# Patient Record
Sex: Female | Born: 1980 | Race: White | Hispanic: No | Smoking: Never smoker
Health system: Southern US, Community
[De-identification: ages and names within clinical notes are randomized; demographics above are authoritative.]

## PROBLEM LIST (undated history)

## (undated) DIAGNOSIS — Z8619 Personal history of other infectious and parasitic diseases: Secondary | ICD-10-CM

## (undated) DIAGNOSIS — T8859XA Other complications of anesthesia, initial encounter: Secondary | ICD-10-CM

## (undated) DIAGNOSIS — R112 Nausea with vomiting, unspecified: Secondary | ICD-10-CM

## (undated) DIAGNOSIS — G43909 Migraine, unspecified, not intractable, without status migrainosus: Secondary | ICD-10-CM

## (undated) DIAGNOSIS — I10 Essential (primary) hypertension: Secondary | ICD-10-CM

## (undated) DIAGNOSIS — T4145XA Adverse effect of unspecified anesthetic, initial encounter: Secondary | ICD-10-CM

## (undated) DIAGNOSIS — R87629 Unspecified abnormal cytological findings in specimens from vagina: Secondary | ICD-10-CM

## (undated) HISTORY — DX: Migraine, unspecified, not intractable, without status migrainosus: G43.909

## (undated) HISTORY — DX: Personal history of other infectious and parasitic diseases: Z86.19

## (undated) HISTORY — PX: LEEP: SHX91

## (undated) HISTORY — DX: Unspecified abnormal cytological findings in specimens from vagina: R87.629

---
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
## 2014-11-05 ENCOUNTER — Encounter (HOSPITAL_COMMUNITY): Payer: Self-pay | Admitting: Emergency Medicine

## 2014-11-05 ENCOUNTER — Emergency Department (HOSPITAL_COMMUNITY): Payer: Federal, State, Local not specified - PPO

## 2014-11-05 ENCOUNTER — Observation Stay (HOSPITAL_COMMUNITY): Payer: Federal, State, Local not specified - PPO

## 2014-11-05 ENCOUNTER — Observation Stay (HOSPITAL_COMMUNITY)
Admission: EM | Admit: 2014-11-05 | Discharge: 2014-11-07 | Disposition: A | Discharge status: Home / Self Care | Payer: Federal, State, Local not specified - PPO | Attending: Obstetrics & Gynecology | Admitting: Obstetrics & Gynecology

## 2014-11-05 DIAGNOSIS — D649 Anemia, unspecified: Secondary | ICD-10-CM | POA: Diagnosis present

## 2014-11-05 DIAGNOSIS — N939 Abnormal uterine and vaginal bleeding, unspecified: Secondary | ICD-10-CM

## 2014-11-05 DIAGNOSIS — K219 Gastro-esophageal reflux disease without esophagitis: Secondary | ICD-10-CM | POA: Diagnosis not present

## 2014-11-05 DIAGNOSIS — Z6841 Body Mass Index (BMI) 40.0 and over, adult: Secondary | ICD-10-CM | POA: Insufficient documentation

## 2014-11-05 DIAGNOSIS — Z79899 Other long term (current) drug therapy: Secondary | ICD-10-CM | POA: Diagnosis not present

## 2014-11-05 DIAGNOSIS — N921 Excessive and frequent menstruation with irregular cycle: Secondary | ICD-10-CM | POA: Diagnosis present

## 2014-11-05 DIAGNOSIS — N840 Polyp of corpus uteri: Secondary | ICD-10-CM | POA: Diagnosis not present

## 2014-11-05 DIAGNOSIS — I10 Essential (primary) hypertension: Secondary | ICD-10-CM | POA: Insufficient documentation

## 2014-11-05 DIAGNOSIS — N946 Dysmenorrhea, unspecified: Secondary | ICD-10-CM | POA: Diagnosis not present

## 2014-11-05 HISTORY — DX: Essential (primary) hypertension: I10

## 2014-11-05 LAB — HEMOGLOBIN AND HEMATOCRIT, BLOOD
HEMATOCRIT: 27.8 % — AB (ref 36.0–46.0)
Hemoglobin: 8.4 g/dL — ABNORMAL LOW (ref 12.0–15.0)

## 2014-11-05 LAB — BASIC METABOLIC PANEL
Anion gap: 8 (ref 5–15)
BUN: 9 mg/dL (ref 6–20)
CHLORIDE: 99 mmol/L — AB (ref 101–111)
CO2: 25 mmol/L (ref 22–32)
CREATININE: 0.79 mg/dL (ref 0.44–1.00)
Calcium: 8.5 mg/dL — ABNORMAL LOW (ref 8.9–10.3)
Glucose, Bld: 123 mg/dL — ABNORMAL HIGH (ref 65–99)
POTASSIUM: 3.8 mmol/L (ref 3.5–5.1)
SODIUM: 132 mmol/L — AB (ref 135–145)

## 2014-11-05 LAB — CBC WITH DIFFERENTIAL/PLATELET
BASOS ABS: 0 10*3/uL (ref 0.0–0.1)
Basophils Relative: 0 %
EOS ABS: 0.1 10*3/uL (ref 0.0–0.7)
EOS PCT: 1 %
HCT: 22.6 % — ABNORMAL LOW (ref 36.0–46.0)
HEMOGLOBIN: 6.4 g/dL — AB (ref 12.0–15.0)
LYMPHS ABS: 2.4 10*3/uL (ref 0.7–4.0)
LYMPHS PCT: 18 %
MCH: 20.3 pg — AB (ref 26.0–34.0)
MCHC: 28.3 g/dL — ABNORMAL LOW (ref 30.0–36.0)
MCV: 71.7 fL — ABNORMAL LOW (ref 78.0–100.0)
Monocytes Absolute: 0.8 10*3/uL (ref 0.1–1.0)
Monocytes Relative: 6 %
NEUTROS PCT: 74 %
Neutro Abs: 9.7 10*3/uL — ABNORMAL HIGH (ref 1.7–7.7)
PLATELETS: 438 10*3/uL — AB (ref 150–400)
RBC: 3.15 MIL/uL — AB (ref 3.87–5.11)
RDW: 18.9 % — ABNORMAL HIGH (ref 11.5–15.5)
WBC: 13 10*3/uL — AB (ref 4.0–10.5)

## 2014-11-05 LAB — I-STAT BETA HCG BLOOD, ED (MC, WL, AP ONLY): HCG, QUANTITATIVE: 8.5 m[IU]/mL — AB (ref <5)

## 2014-11-05 LAB — PREPARE RBC (CROSSMATCH)

## 2014-11-05 LAB — SAMPLE TO BLOOD BANK

## 2014-11-05 LAB — ABO/RH: ABO/RH(D): O NEG

## 2014-11-05 MED ORDER — KETOROLAC TROMETHAMINE 60 MG/2ML IM SOLN: 60.0000 mg | Freq: Once | INTRAMUSCULAR | Status: DC

## 2014-11-05 MED ORDER — MEGESTROL ACETATE 40 MG PO TABS
120.0000 mg | ORAL_TABLET | Freq: Every day | ORAL | Status: DC
  Administered 2014-11-05 – 2014-11-06 (×2): 120 mg via ORAL
  Filled 2014-11-05 (×5): qty 3

## 2014-11-05 MED ORDER — ACETAMINOPHEN 325 MG PO TABS
650.0000 mg | ORAL_TABLET | Freq: Once | ORAL | Status: AC
  Administered 2014-11-05: 650 mg via ORAL
  Filled 2014-11-05: qty 2

## 2014-11-05 MED ORDER — ESTROGENS CONJUGATED 25 MG IJ SOLR
25.0000 mg | Freq: Four times a day (QID) | INTRAMUSCULAR | Status: DC
  Administered 2014-11-05 – 2014-11-07 (×8): 25 mg via INTRAVENOUS
  Filled 2014-11-05 (×17): qty 25

## 2014-11-05 MED ORDER — ESTROGENS CONJUGATED 25 MG IJ SOLR
INTRAMUSCULAR | Status: AC
  Filled 2014-11-05: qty 25

## 2014-11-05 MED ORDER — ESTROGENS CONJUGATED 25 MG IJ SOLR
25.0000 mg | Freq: Once | INTRAMUSCULAR | Status: DC
  Filled 2014-11-05: qty 25

## 2014-11-05 MED ORDER — FUROSEMIDE 10 MG/ML IJ SOLN
20.0000 mg | Freq: Once | INTRAMUSCULAR | Status: AC
  Administered 2014-11-05: 20 mg via INTRAVENOUS
  Filled 2014-11-05: qty 2

## 2014-11-05 MED ORDER — FUROSEMIDE 10 MG/ML IJ SOLN: 20.0000 mg | Freq: Once | INTRAMUSCULAR | Status: AC

## 2014-11-05 MED ORDER — SODIUM CHLORIDE 0.9 % IV SOLN
Freq: Once | INTRAVENOUS | Status: AC
  Administered 2014-11-05: 11:00:00 via INTRAVENOUS

## 2014-11-05 NOTE — Progress Notes (Signed)
Informed by patient that her bleeding had gotten worse since the transvaginal US. Patient states that she has bled through two pads in an hour and a half. MD aware. Will continue to monitor and do pad count.

## 2014-11-05 NOTE — Progress Notes (Signed)
1747 Dr.Eure notified regarding order to transfuse 2 units RBCs. Patient has already received 2 units of RBCs at this time. Awaiting response from MD at this time.  

## 2014-11-05 NOTE — ED Provider Notes (Signed)
CSN: [id number]     Arrival date & time 11/05/14  0718 History  By signing my name below, I, Ronney Lion, attest that this documentation has been prepared under the direction and in the presence of Mancel Bale, MD. Electronically Signed: Ronney Lion, ED Scribe. 11/05/2014. 8:49 AM.   Chief Complaint  Patient presents with  . Dizziness  . Vaginal Bleeding   The history is provided by the patient. No language interpreter was used.    HPI Comments: Sheryl [name] is a 34 y.o. female with a history of hypertension, who presents to the Emergency Department complaining of intermittent lightheaded dizziness that began in August since she began bleeding vaginally, and worsened today. Patient reports she has been on her period since it started in August. She had seen her PCP Lovenia Kim, PA-C 3 days ago and was told she was anemic. She was instructed to come to the ED for worsening symptoms. She reports she has begun to feel a rapid heart beat, lightheadedness, and SOB whenever she stands up, feeling near-syncopal with walking a short distance. Her PCP had given her iron but did not recommend any other treatments. Patient states she vomited once after taking the iron and getting into the shower, although she feels unsure what caused her vomiting. She denies loss of appetite or chest pain. Patient reports a history of hypertension.  Past Medical History  Diagnosis Date  . Hypertension    History reviewed. No pertinent past surgical history. History reviewed. No pertinent family history. Social History  Substance Use Topics  . Smoking status: Never Smoker   . Smokeless tobacco: None  . Alcohol Use: No   OB History    No data available     Review of Systems  All other systems reviewed and are negative.     Allergies  Review of patient's allergies indicates no known allergies.  Home Medications   Prior to Admission medications   Medication Sig Start Date End Date Taking? Authorizing  Provider  bisoprolol-hydrochlorothiazide (ZIAC) 2.5-6.25 MG tablet Take 3 tablets by mouth daily.  11/01/14  Yes Historical Provider, MD  ibuprofen (ADVIL,MOTRIN) 200 MG tablet Take 800 mg by mouth every 6 (six) hours as needed for headache.   Yes Historical Provider, MD  IRON PO Take 2 tablets by mouth daily.   Yes Historical Provider, MD   BP 120/63 mmHg  Pulse 78  Temp(Src) 98.1 F (36.7 C) (Oral)  Resp 16  Ht  (1.727 m)  Wt 277 lb 6.4 oz (125.828 kg)  BMI 42.19 kg/m2  SpO2 99%  LMP 10/13/2014 Physical Exam  Constitutional: She is oriented to person, place, and time. She appears well-developed and well-nourished.  HENT:  Head: Normocephalic and atraumatic.  Eyes: Conjunctivae and EOM are normal. Pupils are equal, round, and reactive to light.  Neck: Normal range of motion and phonation normal. Neck supple.  Cardiovascular: Normal rate and regular rhythm.   Pulmonary/Chest: Effort normal and breath sounds normal. She exhibits no tenderness.  Abdominal: Soft. She exhibits no distension. There is no tenderness. There is no guarding.  Musculoskeletal: Normal range of motion.  Neurological: She is alert and oriented to person, place, and time. She exhibits normal muscle tone.  Skin: Skin is warm and dry.  Psychiatric: She has a normal mood and affect. Her behavior is normal. Judgment and thought content normal.  Nursing note and vitals reviewed.   ED Course  Procedures (including critical care time)  DIAGNOSTIC STUDIES: Oxygen Saturation is 98%  on RA, normal by my interpretation.    COORDINATION OF CARE: 8:45 AM - Discussed treatment plan with pt at bedside which includes blood transfusion. Pt verbalized understanding and agreed to plan.   Labs Review Labs Reviewed  BASIC METABOLIC PANEL - Abnormal; Notable for the following:    Sodium 132 (*)    Chloride 99 (*)    Glucose, Bld 123 (*)    Calcium 8.5 (*)    All other components within normal limits  CBC WITH  DIFFERENTIAL/PLATELET - Abnormal; Notable for the following:    WBC 13.0 (*)    RBC 3.15 (*)    Hemoglobin 6.4 (*)    HCT 22.6 (*)    MCV 71.7 (*)    MCH 20.3 (*)    MCHC 28.3 (*)    RDW 18.9 (*)    Platelets 438 (*)    Neutro Abs 9.7 (*)    All other components within normal limits  I-STAT BETA HCG BLOOD, ED ([facility], [facility], [facility] ONLY) - Abnormal; Notable for the following:    I-stat hCG, quantitative 8.5 (*)    All other components within normal limits  SAMPLE TO BLOOD BANK  TYPE AND SCREEN  PREPARE RBC (CROSSMATCH)  ABO/RH  PREPARE RBC (CROSSMATCH)    Imaging Review US Transvaginal Non-ob  11/05/2014   CLINICAL DATA:  Menometrorrhagia.  Anemia.  LMP 10/13/2014.  EXAM: TRANSABDOMINAL AND TRANSVAGINAL ULTRASOUND OF PELVIS  TECHNIQUE: Both transabdominal and transvaginal ultrasound examinations of the pelvis were performed. Transabdominal technique was performed for global imaging of the pelvis including uterus, ovaries, adnexal regions, and pelvic cul-de-sac. It was necessary to proceed with endovaginal exam following the transabdominal exam to visualize the endometrium and cystic lesion in right adnexa.  COMPARISON:  None  FINDINGS: Uterus  Measurements: 10.2 x 5.1 x 5.5 cm. Diffusely heterogeneous echotexture of uterine myometrium noted, however no distinct fibroids visualized.  Endometrium  Thickness: 24 mm. Heterogeneous appearance noted, although no definite focal abnormality visualized.  Right ovary  Measurements: 5.5 x 4.5 x 5.3 cm. 3.7 cm benign appearing appearing cyst noted. No complex cystic or solid masses visualized.  Left ovary  Measurements: 3.1 x 1.9 x 2.4 cm. Normal appearance/no adnexal mass.  Other findings  No free fluid.  IMPRESSION: Diffusely heterogeneous myometrial echotexture, without definite fibroids.  Endometrial thickness measures 24 mm. If bleeding remains unresponsive to hormonal or medical therapy, focal lesion work-up with sonohysterogram or hysteroscopy should be  considered. Endometrial biopsy should also be considered in pre-menopausal patients at high risk for endometrial carcinoma. (Ref: Radiological Reasoning: Algorithmic Workup of Abnormal Vaginal Bleeding with Endovaginal Sonography and Sonohysterography. AJR 2008; 161:W96-04).  3.7 cm benign appearing right ovarian cyst.   Electronically Signed   By: Myles Rosenthal M.D.   On: 11/05/2014 12:05   US Pelvis Complete  11/05/2014   CLINICAL DATA:  Menometrorrhagia.  Anemia.  LMP 10/13/2014.  EXAM: TRANSABDOMINAL AND TRANSVAGINAL ULTRASOUND OF PELVIS  TECHNIQUE: Both transabdominal and transvaginal ultrasound examinations of the pelvis were performed. Transabdominal technique was performed for global imaging of the pelvis including uterus, ovaries, adnexal regions, and pelvic cul-de-sac. It was necessary to proceed with endovaginal exam following the transabdominal exam to visualize the endometrium and cystic lesion in right adnexa.  COMPARISON:  None  FINDINGS: Uterus  Measurements: 10.2 x 5.1 x 5.5 cm. Diffusely heterogeneous echotexture of uterine myometrium noted, however no distinct fibroids visualized.  Endometrium  Thickness: 24 mm. Heterogeneous appearance noted, although no definite focal abnormality visualized.  Right ovary  Measurements: 5.5 x 4.5 x 5.3 cm. 3.7 cm benign appearing appearing cyst noted. No complex cystic or solid masses visualized.  Left ovary  Measurements: 3.1 x 1.9 x 2.4 cm. Normal appearance/no adnexal mass.  Other findings  No free fluid.  IMPRESSION: Diffusely heterogeneous myometrial echotexture, without definite fibroids.  Endometrial thickness measures 24 mm. If bleeding remains unresponsive to hormonal or medical therapy, focal lesion work-up with sonohysterogram or hysteroscopy should be considered. Endometrial biopsy should also be considered in pre-menopausal patients at high risk for endometrial carcinoma. (Ref: Radiological Reasoning: Algorithmic Workup of Abnormal Vaginal Bleeding  with Endovaginal Sonography and Sonohysterography. AJR 2008; 147:W29-56).  3.7 cm benign appearing right ovarian cyst.   Electronically Signed   By: Myles Rosenthal M.D.   On: 11/05/2014 12:05   I have personally reviewed and evaluated these images and lab results as part of my medical decision-making.   EKG Interpretation None      MDM   Final diagnoses:  Vaginal bleeding  Menometrorrhagia  Anemia   Symptomatic anemia likely from heavy vaginal bleeding. She will require transfusion, and monitoring as well as treatment by gynecology. She is hemodynamically stable in the emergency department.  Nursing Notes Reviewed/ Care Coordinated Applicable Imaging Reviewed Interpretation of Laboratory Data incorporated into ED treatment  I personally performed the services described in this documentation, which was scribed in my presence. The recorded information has been reviewed and is accurate.       Mancel Bale, MD 11/05/14 [phone]

## 2014-11-05 NOTE — ED Notes (Signed)
Obtained consent from patient for blood product administration.

## 2014-11-05 NOTE — ED Notes (Signed)
CRITICAL VALUE ALERT  Critical value received:  Hgb 6.4  Date of notification: 11/05/2014  Time of notification:  0820  Critical value read back:YES  Nurse who received alert: Trula Ore, RN

## 2014-11-05 NOTE — ED Notes (Signed)
Patient complaining of dizziness since August. States she saw her PCP on Friday and was told she was anemic and if it got worse to go to hospital. States dizziness worsened this morning. Patient states she has had vaginal bleeding since August but has not followed up with gynecologist.

## 2014-11-06 LAB — HEMOGLOBIN AND HEMATOCRIT, BLOOD
HCT: 29.9 % — ABNORMAL LOW (ref 36.0–46.0)
Hemoglobin: 9.4 g/dL — ABNORMAL LOW (ref 12.0–15.0)

## 2014-11-06 MED ORDER — CEFAZOLIN SODIUM-DEXTROSE 2-3 GM-% IV SOLR
2.0000 g | Freq: Once | INTRAVENOUS | Status: AC
  Administered 2014-11-06: 2 g via INTRAVENOUS
  Filled 2014-11-06: qty 50

## 2014-11-06 NOTE — Progress Notes (Signed)
Patient reports that bleedign is improved and "slowed" down this AM. She has changed pads a total of 3 times on this shift.

## 2014-11-07 ENCOUNTER — Observation Stay (HOSPITAL_COMMUNITY): Payer: Federal, State, Local not specified - PPO | Admitting: Anesthesiology

## 2014-11-07 ENCOUNTER — Encounter (HOSPITAL_COMMUNITY): Payer: Self-pay | Admitting: *Deleted

## 2014-11-07 ENCOUNTER — Encounter (HOSPITAL_COMMUNITY)
Admission: EM | Disposition: A | Discharge status: Home / Self Care | Payer: Self-pay | Source: Home / Self Care | Attending: Emergency Medicine

## 2014-11-07 DIAGNOSIS — D500 Iron deficiency anemia secondary to blood loss (chronic): Secondary | ICD-10-CM

## 2014-11-07 DIAGNOSIS — N920 Excessive and frequent menstruation with regular cycle: Secondary | ICD-10-CM

## 2014-11-07 HISTORY — PX: DILITATION & CURRETTAGE/HYSTROSCOPY WITH NOVASURE ABLATION: SHX5568

## 2014-11-07 LAB — TYPE AND SCREEN
ABO/RH(D): O NEG
ANTIBODY SCREEN: NEGATIVE
UNIT DIVISION: 0
Unit division: 0
Unit division: 0
Unit division: 0
Unit division: 0

## 2014-11-07 LAB — SURGICAL PCR SCREEN
MRSA, PCR: NEGATIVE
STAPHYLOCOCCUS AUREUS: NEGATIVE

## 2014-11-07 SURGERY — DILATATION & CURETTAGE/HYSTEROSCOPY WITH NOVASURE ABLATION
Anesthesia: General | Site: Vagina

## 2014-11-07 MED ORDER — ONDANSETRON HCL 4 MG/2ML IJ SOLN
4.0000 mg | Freq: Once | INTRAMUSCULAR | Status: AC
Start: 2014-11-07 — End: 2014-11-07
  Administered 2014-11-07: 4 mg via INTRAVENOUS

## 2014-11-07 MED ORDER — LIDOCAINE HCL (PF) 1 % IJ SOLN
INTRAMUSCULAR | Status: AC
  Filled 2014-11-07: qty 5

## 2014-11-07 MED ORDER — SCOPOLAMINE 1 MG/3DAYS TD PT72
1.0000 | MEDICATED_PATCH | Freq: Once | TRANSDERMAL | Status: DC
  Administered 2014-11-07: 1.5 mg via TRANSDERMAL

## 2014-11-07 MED ORDER — FENTANYL CITRATE (PF) 100 MCG/2ML IJ SOLN
25.0000 ug | INTRAMUSCULAR | Status: DC | PRN
  Administered 2014-11-07 (×2): 50 ug via INTRAVENOUS
  Filled 2014-11-07: qty 2

## 2014-11-07 MED ORDER — GLYCOPYRROLATE 0.2 MG/ML IJ SOLN
INTRAMUSCULAR | Status: DC | PRN
  Administered 2014-11-07: .2 mg via INTRAVENOUS

## 2014-11-07 MED ORDER — LACTATED RINGERS IV SOLN
INTRAVENOUS | Status: DC
  Administered 2014-11-07: 13:00:00 via INTRAVENOUS

## 2014-11-07 MED ORDER — ONDANSETRON HCL 4 MG/2ML IJ SOLN
INTRAMUSCULAR | Status: AC
  Filled 2014-11-07: qty 2

## 2014-11-07 MED ORDER — ONDANSETRON HCL 8 MG PO TABS: 8.0000 mg | ORAL_TABLET | Freq: Three times a day (TID) | ORAL | Status: DC | PRN

## 2014-11-07 MED ORDER — HYDROCODONE-ACETAMINOPHEN 5-325 MG PO TABS: 1.0000 | ORAL_TABLET | Freq: Four times a day (QID) | ORAL | Status: DC | PRN

## 2014-11-07 MED ORDER — MIDAZOLAM HCL 2 MG/2ML IJ SOLN
INTRAMUSCULAR | Status: AC
  Filled 2014-11-07: qty 2

## 2014-11-07 MED ORDER — LIDOCAINE HCL (CARDIAC) 20 MG/ML IV SOLN
INTRAVENOUS | Status: DC | PRN
  Administered 2014-11-07: 50 mg via INTRAVENOUS

## 2014-11-07 MED ORDER — PROPOFOL 10 MG/ML IV BOLUS
INTRAVENOUS | Status: AC
  Filled 2014-11-07: qty 20

## 2014-11-07 MED ORDER — CEFAZOLIN SODIUM-DEXTROSE 2-3 GM-% IV SOLR
INTRAVENOUS | Status: DC | PRN
  Administered 2014-11-07: 2 g via INTRAVENOUS

## 2014-11-07 MED ORDER — NEOSTIGMINE METHYLSULFATE 10 MG/10ML IV SOLN
INTRAVENOUS | Status: DC | PRN
  Administered 2014-11-07: 1.5 mg via INTRAVENOUS

## 2014-11-07 MED ORDER — SCOPOLAMINE 1 MG/3DAYS TD PT72
MEDICATED_PATCH | TRANSDERMAL | Status: AC
  Filled 2014-11-07: qty 1

## 2014-11-07 MED ORDER — ONDANSETRON HCL 4 MG/2ML IJ SOLN: 4.0000 mg | Freq: Once | INTRAMUSCULAR | Status: DC | PRN

## 2014-11-07 MED ORDER — CEFAZOLIN SODIUM-DEXTROSE 2-3 GM-% IV SOLR
INTRAVENOUS | Status: AC
  Filled 2014-11-07: qty 50

## 2014-11-07 MED ORDER — FENTANYL CITRATE (PF) 250 MCG/5ML IJ SOLN
INTRAMUSCULAR | Status: AC
  Filled 2014-11-07: qty 25

## 2014-11-07 MED ORDER — FENTANYL CITRATE (PF) 250 MCG/5ML IJ SOLN
INTRAMUSCULAR | Status: DC | PRN
  Administered 2014-11-07 (×4): 50 ug via INTRAVENOUS

## 2014-11-07 MED ORDER — MIDAZOLAM HCL 2 MG/2ML IJ SOLN
1.0000 mg | INTRAMUSCULAR | Status: DC | PRN
  Administered 2014-11-07: 2 mg via INTRAVENOUS

## 2014-11-07 MED ORDER — ROCURONIUM BROMIDE 50 MG/5ML IV SOLN
INTRAVENOUS | Status: AC
  Filled 2014-11-07: qty 1

## 2014-11-07 MED ORDER — KETOROLAC TROMETHAMINE 10 MG PO TABS: 10.0000 mg | ORAL_TABLET | Freq: Three times a day (TID) | ORAL | Status: DC | PRN

## 2014-11-07 MED ORDER — 0.9 % SODIUM CHLORIDE (POUR BTL) OPTIME
TOPICAL | Status: DC | PRN
  Administered 2014-11-07: 1000 mL

## 2014-11-07 MED ORDER — ESTROGENS CONJUGATED 25 MG IJ SOLR
INTRAMUSCULAR | Status: AC
  Filled 2014-11-07: qty 25

## 2014-11-07 MED ORDER — ROCURONIUM BROMIDE 100 MG/10ML IV SOLN
INTRAVENOUS | Status: DC | PRN
  Administered 2014-11-07: 8 mg via INTRAVENOUS

## 2014-11-07 MED ORDER — PROPOFOL 10 MG/ML IV BOLUS
INTRAVENOUS | Status: DC | PRN
  Administered 2014-11-07: 160 mg via INTRAVENOUS

## 2014-11-07 MED ORDER — PROPOFOL 10 MG/ML IV BOLUS
INTRAVENOUS | Status: AC
Start: 2014-11-07 — End: 2014-11-07
  Filled 2014-11-07: qty 20

## 2014-11-07 MED ORDER — SUCCINYLCHOLINE CHLORIDE 20 MG/ML IJ SOLN
INTRAMUSCULAR | Status: DC | PRN
  Administered 2014-11-07: 140 mg via INTRAVENOUS

## 2014-11-07 SURGICAL SUPPLY — 25 items
ABLATOR ENDOMETRIAL BIPOLAR (ABLATOR) ×2 IMPLANT
BAG HAMPER (MISCELLANEOUS) ×2 IMPLANT
CLOTH BEACON ORANGE TIMEOUT ST (SAFETY) ×2 IMPLANT
COVER LIGHT HANDLE STERIS (MISCELLANEOUS) ×4 IMPLANT
FORMALIN 10 PREFIL 120ML (MISCELLANEOUS) ×2 IMPLANT
GLOVE BIOGEL PI IND STRL 7.0 (GLOVE) ×1 IMPLANT
GLOVE BIOGEL PI IND STRL 8 (GLOVE) ×1 IMPLANT
GLOVE BIOGEL PI INDICATOR 7.0 (GLOVE) ×1
GLOVE BIOGEL PI INDICATOR 8 (GLOVE) ×1
GLOVE ECLIPSE 6.5 STRL STRAW (GLOVE) ×2 IMPLANT
GLOVE ECLIPSE 8.0 STRL XLNG CF (GLOVE) ×2 IMPLANT
GLOVE INDICATOR 7.0 STRL GRN (GLOVE) ×4 IMPLANT
GOWN STRL REUS W/TWL LRG LVL3 (GOWN DISPOSABLE) ×2 IMPLANT
GOWN STRL REUS W/TWL XL LVL3 (GOWN DISPOSABLE) ×2 IMPLANT
INST SET HYSTEROSCOPY (KITS) ×2 IMPLANT
IV NS 1000ML (IV SOLUTION) ×1
IV NS 1000ML BAXH (IV SOLUTION) ×1 IMPLANT
KIT ROOM TURNOVER AP CYSTO (KITS) ×2 IMPLANT
MANIFOLD NEPTUNE II (INSTRUMENTS) ×2 IMPLANT
NS IRRIG 1000ML POUR BTL (IV SOLUTION) ×2 IMPLANT
PACK PERI GYN (CUSTOM PROCEDURE TRAY) ×2 IMPLANT
PAD ARMBOARD 7.5X6 YLW CONV (MISCELLANEOUS) ×2 IMPLANT
PAD TELFA 3X4 1S STER (GAUZE/BANDAGES/DRESSINGS) ×2 IMPLANT
SET BASIN LINEN APH (SET/KITS/TRAYS/PACK) ×2 IMPLANT
SET IRRIG Y TYPE TUR BLADDER L (SET/KITS/TRAYS/PACK) ×2 IMPLANT

## 2014-11-07 NOTE — Interval H&P Note (Signed)
History and Physical Interval Note:  11/07/2014 1:05 PM  Sheryl [name]  has presented today for surgery, with the diagnosis of anemia, menometorrhagia  The various methods of treatment have been discussed with the patient and family. After consideration of risks, benefits and other options for treatment, the patient has consented to  Procedure(s): DILATATION & CURETTAGE/HYSTEROSCOPY WITH NOVASURE ABLATION (N/A) as a surgical intervention .  The patient's history has been reviewed, patient examined, no change in status, stable for surgery.  I have reviewed the patient's chart and labs.  Questions were answered to the patient's satisfaction.     EURE,LUTHER H

## 2014-11-07 NOTE — H&P (Signed)
History and Physical  Sheryl [name] is a 34 y.o. g1p1. with Patient's last menstrual period was 10/13/2014. admitted for a symptomatic anemia and menometrorrhagia for 3 weeks. Has history of heavy cycles but never like this, came in dizzy light headed and SOB.  Hgb 6.5, admitted for bleeding management with premarin and megestrol and transfusion of PRBC total of 3 units.  Discussed options with patient and will proceed with hysteroscopy uterine curettage endometrial ablation per pt decision of the options of conservative management   PMH:    Past Medical History  Diagnosis Date  . Hypertension     PSH:    History reviewed. No pertinent past surgical history.  POb/GynH:      OB History    No data available      SH:   Social History  Substance Use Topics  . Smoking status: Never Smoker   . Smokeless tobacco: None  . Alcohol Use: No    FH:   History reviewed. No pertinent family history.   Allergies: No Known Allergies  Medications:       Current facility-administered medications:  .  [MAR Hold] conjugated estrogens (PREMARIN) injection 25 mg, 25 mg, Intravenous, Q6H, Lazaro Arms, MD, 25 mg at 11/07/14 0630 .  [MAR Hold] ketorolac (TORADOL) injection 60 mg, 60 mg, Intramuscular, Once, Mancel Bale, MD, 60 mg at 11/05/14 0800 .  lactated ringers infusion, , Intravenous, Continuous, Benita Stabile, MD, Last Rate: 75 mL/hr at 11/07/14 1237 .  [MAR Hold] megestrol (MEGACE) tablet 120 mg, 120 mg, Oral, Daily, Lazaro Arms, MD, 120 mg at 11/06/14 1036 .  midazolam (VERSED) injection 1-2 mg, 1-2 mg, Intravenous, Q5 min PRN, Benita Stabile, MD, 2 mg at 11/07/14 1238 .  scopolamine (TRANSDERM-SCOP) 1 MG/3DAYS 1.5 mg, 1 patch, Transdermal, Once, Benita Stabile, MD, 1.5 mg at 11/07/14 1239  Review of Systems:   Review of Systems  Constitutional: Negative for fever, chills, weight loss, malaise/fatigue and diaphoresis.  HENT: Negative for hearing loss, ear pain,  nosebleeds, congestion, sore throat, neck pain, tinnitus and ear discharge.   Eyes: Negative for blurred vision, double vision, photophobia, pain, discharge and redness.  Respiratory: Negative for cough, hemoptysis, sputum production, shortness of breath, wheezing and stridor.   Cardiovascular: Negative for chest pain, palpitations, orthopnea, claudication, leg swelling and PND.  Gastrointestinal: Positive for abdominal pain. Negative for heartburn, nausea, vomiting, diarrhea, constipation, blood in stool and melena.  Genitourinary: Negative for dysuria, urgency, frequency, hematuria and flank pain.  Musculoskeletal: Negative for myalgias, back pain, joint pain and falls.  Skin: Negative for itching and rash.  Neurological: Negative for dizziness, tingling, tremors, sensory change, speech change, focal weakness, seizures, loss of consciousness, weakness and headaches.  Endo/Heme/Allergies: Negative for environmental allergies and polydipsia. Does not bruise/bleed easily.  Psychiatric/Behavioral: Negative for depression, suicidal ideas, hallucinations, memory loss and substance abuse. The patient is not nervous/anxious and does not have insomnia.      PHYSICAL EXAM:  Blood pressure 150/92, pulse 88, temperature 98 F (36.7 C), temperature source Oral, resp. rate 20, height  (1.727 m), weight 277 lb (125.646 kg), last menstrual period 10/13/2014, SpO2 96 %.    Vitals reviewed. Constitutional: She is oriented to person, place, and time. She appears well-developed and well-nourished.  HENT:  Head: Normocephalic and atraumatic.  Right Ear: External ear normal.  Left Ear: External ear normal.  Nose: Nose normal.  Mouth/Throat: Oropharynx is clear and moist.  Eyes: Conjunctivae and EOM are normal.  Pupils are equal, round, and reactive to light. Right eye exhibits no discharge. Left eye exhibits no discharge. No scleral icterus.  Neck: Normal range of motion. Neck supple. No tracheal  deviation present. No thyromegaly present.  Cardiovascular: Normal rate, regular rhythm, normal heart sounds and intact distal pulses.  Exam reveals no gallop and no friction rub.   No murmur heard. Respiratory: Effort normal and breath sounds normal. No respiratory distress. She has no wheezes. She has no rales. She exhibits no tenderness.  GI: Soft. Bowel sounds are normal. She exhibits no distension and no mass. There is tenderness. There is no rebound and no guarding.  Genitourinary:       Vulva is normal without lesions Vagina is pink moist without discharge Cervix normal in appearance and  per sonogram Uterus is normal size, contour, position, consistency, mobility, non-tender, ;ast pap smear 2 years or so ago and normal by report Adnexa is negative with normal sized ovaries by sonogram small physiologic cyst Musculoskeletal: Normal range of motion. She exhibits no edema and no tenderness.  Neurological: She is alert and oriented to person, place, and time. She has normal reflexes. She displays normal reflexes. No cranial nerve deficit. She exhibits normal muscle tone. Coordination normal.  Skin: Skin is warm and dry. No rash noted. No erythema. No pallor.  Psychiatric: She has a normal mood and affect. Her behavior is normal. Judgment and thought content normal.    Labs: Results for orders placed or performed during the hospital encounter of 11/05/14 (from the past 336 hour(s))  Basic metabolic panel   Collection Time: 11/05/14  7:37 AM  Result Value Ref Range   Sodium 132 (L) 135 - 145 mmol/L   Potassium 3.8 3.5 - 5.1 mmol/L   Chloride 99 (L) 101 - 111 mmol/L   CO2 25 22 - 32 mmol/L   Glucose, Bld 123 (H) 65 - 99 mg/dL   BUN 9 6 - 20 mg/dL   Creatinine, Ser 1.61 0.44 - 1.00 mg/dL   Calcium 8.5 (L) 8.9 - 10.3 mg/dL   GFR calc non Af Amer >60 >60 mL/min   GFR calc Af Amer >60 >60 mL/min   Anion gap 8 5 - 15  CBC with Differential   Collection Time: 11/05/14  7:37 AM  Result  Value Ref Range   WBC 13.0 (H) 4.0 - 10.5 K/uL   RBC 3.15 (L) 3.87 - 5.11 MIL/uL   Hemoglobin 6.4 (LL) 12.0 - 15.0 g/dL   HCT 09.6 (L) 04.5 - 40.9 %   MCV 71.7 (L) 78.0 - 100.0 fL   MCH 20.3 (L) 26.0 - 34.0 pg   MCHC 28.3 (L) 30.0 - 36.0 g/dL   RDW 81.1 (H) 91.4 - 78.2 %   Platelets 438 (H) 150 - 400 K/uL   Neutrophils Relative % 74 %   Neutro Abs 9.7 (H) 1.7 - 7.7 K/uL   Lymphocytes Relative 18 %   Lymphs Abs 2.4 0.7 - 4.0 K/uL   Monocytes Relative 6 %   Monocytes Absolute 0.8 0.1 - 1.0 K/uL   Eosinophils Relative 1 %   Eosinophils Absolute 0.1 0.0 - 0.7 K/uL   Basophils Relative 0 %   Basophils Absolute 0.0 0.0 - 0.1 K/uL  Sample to Blood Bank   Collection Time: 11/05/14  7:51 AM  Result Value Ref Range   Blood Bank Specimen SAMPLE AVAILABLE FOR TESTING    Sample Expiration 11/08/2014   Type and screen   Collection Time: 11/05/14  7:51 AM  Result Value Ref Range   ABO/RH(D) O NEG    Antibody Screen NEG    Sample Expiration 11/08/2014    Unit Number [id number]    Blood Component Type RED CELLS,LR    Unit division 00    Status of Unit ISSUED,FINAL    Transfusion Status OK TO TRANSFUSE    Crossmatch Result Compatible    Unit Number [id number]    Blood Component Type RED CELLS,LR    Unit division 00    Status of Unit ISSUED,FINAL    Transfusion Status OK TO TRANSFUSE    Crossmatch Result Compatible    Unit Number [id number]    Blood Component Type RED CELLS,LR    Unit division 00    Status of Unit ALLOCATED    Transfusion Status OK TO TRANSFUSE    Crossmatch Result Compatible    Unit Number [id number]    Blood Component Type RED CELLS,LR    Unit division 00    Status of Unit ISSUED,FINAL    Transfusion Status OK TO TRANSFUSE    Crossmatch Result Compatible    Unit Number [id number]    Blood Component Type RED CELLS,LR    Unit division 00    Status of Unit ALLOCATED    Transfusion Status OK TO TRANSFUSE    Crossmatch Result Compatible   Prepare  RBC   Collection Time: 11-28-2014  7:51 AM  Result Value Ref Range   Order Confirmation ORDER PROCESSED BY BLOOD BANK   ABO/Rh   Collection Time: 11/28/14  7:51 AM  Result Value Ref Range   ABO/RH(D) O NEG   Prepare RBC   Collection Time: 28-Nov-2014  7:51 AM  Result Value Ref Range   Order Confirmation ORDER PROCESSED BY BLOOD BANK   I-Stat Beta hCG blood, ED ([facility], [facility], [facility] only)   Collection Time: 2014-11-28 10:00 AM  Result Value Ref Range   I-stat hCG, quantitative 8.5 (H) <5 mIU/mL   Comment 3          Hemoglobin and hematocrit, blood   Collection Time: 11/28/14  8:52 PM  Result Value Ref Range   Hemoglobin 8.4 (L) 12.0 - 15.0 g/dL   HCT 25.9 (L) 56.3 - 87.5 %  Hemoglobin and hematocrit, blood   Collection Time: 11/06/14  7:28 AM  Result Value Ref Range   Hemoglobin 9.4 (L) 12.0 - 15.0 g/dL   HCT 64.3 (L) 32.9 - 51.8 %  Surgical pcr screen   Collection Time: 11/07/14 12:46 AM  Result Value Ref Range   MRSA, PCR NEGATIVE NEGATIVE   Staphylococcus aureus NEGATIVE NEGATIVE    EKG: No orders found for this or any previous visit.  Imaging Studies: US Transvaginal Non-ob  Nov 28, 2014   CLINICAL DATA:  Menometrorrhagia.  Anemia.  LMP 10/13/2014.  EXAM: TRANSABDOMINAL AND TRANSVAGINAL ULTRASOUND OF PELVIS  TECHNIQUE: Both transabdominal and transvaginal ultrasound examinations of the pelvis were performed. Transabdominal technique was performed for global imaging of the pelvis including uterus, ovaries, adnexal regions, and pelvic cul-de-sac. It was necessary to proceed with endovaginal exam following the transabdominal exam to visualize the endometrium and cystic lesion in right adnexa.  COMPARISON:  None  FINDINGS: Uterus  Measurements: 10.2 x 5.1 x 5.5 cm. Diffusely heterogeneous echotexture of uterine myometrium noted, however no distinct fibroids visualized.  Endometrium  Thickness: 24 mm. Heterogeneous appearance noted, although no definite focal abnormality visualized.  Right ovary   Measurements: 5.5 x 4.5 x 5.3 cm. 3.7 cm benign appearing appearing cyst  noted. No complex cystic or solid masses visualized.  Left ovary  Measurements: 3.1 x 1.9 x 2.4 cm. Normal appearance/no adnexal mass.  Other findings  No free fluid.  IMPRESSION: Diffusely heterogeneous myometrial echotexture, without definite fibroids.  Endometrial thickness measures 24 mm. If bleeding remains unresponsive to hormonal or medical therapy, focal lesion work-up with sonohysterogram or hysteroscopy should be considered. Endometrial biopsy should also be considered in pre-menopausal patients at high risk for endometrial carcinoma. (Ref: Radiological Reasoning: Algorithmic Workup of Abnormal Vaginal Bleeding with Endovaginal Sonography and Sonohysterography. AJR 2008; 161:W96-04).  3.7 cm benign appearing right ovarian cyst.   Electronically Signed   By: Myles Rosenthal M.D.   On: 11/05/2014 12:05   US Pelvis Complete  11/05/2014   CLINICAL DATA:  Menometrorrhagia.  Anemia.  LMP 10/13/2014.  EXAM: TRANSABDOMINAL AND TRANSVAGINAL ULTRASOUND OF PELVIS  TECHNIQUE: Both transabdominal and transvaginal ultrasound examinations of the pelvis were performed. Transabdominal technique was performed for global imaging of the pelvis including uterus, ovaries, adnexal regions, and pelvic cul-de-sac. It was necessary to proceed with endovaginal exam following the transabdominal exam to visualize the endometrium and cystic lesion in right adnexa.  COMPARISON:  None  FINDINGS: Uterus  Measurements: 10.2 x 5.1 x 5.5 cm. Diffusely heterogeneous echotexture of uterine myometrium noted, however no distinct fibroids visualized.  Endometrium  Thickness: 24 mm. Heterogeneous appearance noted, although no definite focal abnormality visualized.  Right ovary  Measurements: 5.5 x 4.5 x 5.3 cm. 3.7 cm benign appearing appearing cyst noted. No complex cystic or solid masses visualized.  Left ovary  Measurements: 3.1 x 1.9 x 2.4 cm. Normal appearance/no adnexal  mass.  Other findings  No free fluid.  IMPRESSION: Diffusely heterogeneous myometrial echotexture, without definite fibroids.  Endometrial thickness measures 24 mm. If bleeding remains unresponsive to hormonal or medical therapy, focal lesion work-up with sonohysterogram or hysteroscopy should be considered. Endometrial biopsy should also be considered in pre-menopausal patients at high risk for endometrial carcinoma. (Ref: Radiological Reasoning: Algorithmic Workup of Abnormal Vaginal Bleeding with Endovaginal Sonography and Sonohysterography. AJR 2008; 540:J81-19).  3.7 cm benign appearing right ovarian cyst.   Electronically Signed   By: Myles Rosenthal M.D.   On: 11/05/2014 12:05      Assessment: Menometrorrhagia Anemia, symptomatic, acute on chronic Patient Active Problem List   Diagnosis Date Noted  . Anemia 11/05/2014    Plan: Hysteroscopy uterine curettage endometrial ablation  Gresham Caetano H 11/07/2014 1:05 PM

## 2014-11-07 NOTE — Anesthesia Postprocedure Evaluation (Signed)
  Anesthesia Post-op Note  Patient: Sheryl [name]  Procedure(s) Performed: Procedure(s): DILATATION & CURETTAGE/HYSTEROSCOPY WITH NOVASURE ABLATION  Uterine Cavity Length 6.0cm Uterine Cavity Width 4.6cm Power 152 watts 49 sec is Time (N/A)  Patient Location: PACU  Anesthesia Type:General  Level of Consciousness: awake, alert  and oriented  Airway and Oxygen Therapy: Patient Spontanous Breathing and Patient connected to nasal cannula oxygen  Post-op Pain: none  Post-op Assessment: Post-op Vital signs reviewed, Patient's Cardiovascular Status Stable, Respiratory Function Stable, Patent Airway, No signs of Nausea or vomiting and Adequate PO intake              Post-op Vital Signs: Reviewed and stable  Last Vitals:  Filed Vitals:   11/07/14 1531  BP: 149/77  Pulse: 95  Temp: 36.9 C  Resp: 16    Complications: No apparent anesthesia complications

## 2014-11-07 NOTE — Care Management Note (Signed)
Case Management Note  Patient Details  Name: Sheryl [name] MRN: [id number] Date of Birth: 1980-05-03  Expected Discharge Date:  11/08/14               Expected Discharge Plan:  Home/Self Care  In-House Referral:  NA  Discharge planning Services  CM Consult  Post Acute Care Choice:  NA Choice offered to:  NA  DME Arranged:    DME Agency:     HH Arranged:    HH Agency:     Status of Service:  Completed, signed off  Medicare Important Message Given:    Date Medicare IM Given:    Medicare IM give by:    Date Additional Medicare IM Given:    Additional Medicare Important Message give by:     If discussed at Long Length of Stay Meetings, dates discussed:    Additional Comments: Pt is from home, ind at baseline. Anticipate DC home today after procedure. Pt plans to DC home with self care. No CM needs at this time.  Malcolm Metro, RN 11/07/2014, 2:20 PM

## 2014-11-07 NOTE — Discharge Instructions (Signed)
Endometrial Ablation °Endometrial ablation removes the lining of the uterus (endometrium). It is usually a same-day, outpatient treatment. Ablation helps avoid major surgery, such as surgery to remove the cervix and uterus (hysterectomy). After endometrial ablation, you will have little or no menstrual bleeding and may not be able to have children. However, if you are premenopausal, you will need to use a reliable method of birth control following the procedure because of the small chance that pregnancy can occur. °There are different reasons to have this procedure. These reasons include: °· Heavy periods. °· Bleeding that is causing anemia. °· Irregular bleeding. °· Bleeding fibroids on the lining inside the uterus if they are smaller than 3 centimeters. °This procedure may not be possible for you if:  °· You want to have children in the future.   °· You have severe cramps with your menstrual period.   °· You have precancerous or cancerous cells in your uterus.   °· You were recently pregnant.   °· You have gone through menopause.   °· You have had major surgery on your uterus, resulting in thinning of the uterine wall. Surgeries may include: °¨ The removal of one or more uterine fibroids (myomectomy). °¨ A cesarean section with a classic (vertical) incision on your uterus. Ask your health care provider what type of cesarean you had. Sometimes the scar on your skin is different than the scar on your uterus. °Even if you have had surgery on your uterus, certain types of ablation may still be safe for you. Talk with your health care provider. °LET YOUR HEALTH CARE PROVIDER KNOW ABOUT: °· Any allergies you have. °· All medicines you are taking, including vitamins, herbs, eye drops, creams, and over-the-counter medicines. °· Previous problems you or members of your family have had with the use of anesthetics. °· Any blood disorders you have. °· Previous surgeries you have had. °· Medical conditions you have. °RISKS AND  COMPLICATIONS  °Generally, this is a safe procedure. However, as with any procedure, complications can occur. Possible complications include: °· Perforation of the uterus. °· Bleeding. °· Infection of the uterus, bladder, or vagina. °· Injury to surrounding organs. °· An air bubble to the lung (air embolus). °· Pregnancy following the procedure. °· Failure of the procedure to help the problem, requiring hysterectomy. °· Decreased ability to diagnose cancer in the lining of the uterus. °BEFORE THE PROCEDURE °· The lining of the uterus must be tested to make sure there is no pre-cancerous or cancer cells present. °· An ultrasound may be performed to look at the size of the uterus and to check for abnormalities. °· Medicines may be given to thin the lining of the uterus. °PROCEDURE  °During the procedure, your health care provider will use a tool called a resectoscope to help see inside your uterus. There are different ways to remove the lining of your uterus.  °· Radiofrequency - This method uses a radiofrequency-alternating electric current to remove the lining of the uterus. °· Cryotherapy - This method uses extreme cold to freeze the lining of the uterus. °· Heated-Free Liquid - This method uses heated salt (saline) solution to remove the lining of the uterus. °· Microwave - This method uses high-energy microwaves to heat up the lining of the uterus to remove it. °· Thermal balloon - This method involves inserting a catheter with a balloon tip into the uterus. The balloon tip is filled with heated fluid to remove the lining of the uterus. °AFTER THE PROCEDURE  °After your procedure, do   not have sexual intercourse or insert anything into your vagina until permitted by your health care provider. After the procedure, you may experience: °· Cramps. °· Vaginal discharge. °· Frequent urination. °  °This information is not intended to replace advice given to you by your health care provider. Make sure you discuss any  questions you have with your health care provider. °  °Document Released: 11/29/2003 Document Revised: 10/10/2014 Document Reviewed: 06/22/2012 °Elsevier Interactive Patient Education ©2016 [employer] ° °

## 2014-11-07 NOTE — Anesthesia Preprocedure Evaluation (Signed)
Anesthesia Evaluation  Patient identified by MRN, date of birth, ID band Patient awake    Reviewed: Allergy & Precautions, NPO status , Patient's Chart, lab work & pertinent test results, reviewed documented beta blocker date and time   Airway Mallampati: III  TM Distance: <3 FB Neck ROM: Full  Mouth opening: Limited Mouth Opening  Dental  (+) Chipped, Missing,    Pulmonary neg pulmonary ROS,    Pulmonary exam normal        Cardiovascular hypertension, Pt. on home beta blockers Normal cardiovascular exam     Neuro/Psych negative neurological ROS  negative psych ROS   GI/Hepatic Neg liver ROS, GERD  Poorly Controlled,  Endo/Other  Morbid obesity  Renal/GU negative Renal ROS  negative genitourinary   Musculoskeletal negative musculoskeletal ROS (+)   Abdominal Normal abdominal exam  (+) + obese,   Peds  Hematology  (+) Blood dyscrasia, anemia ,   Anesthesia Other Findings   Reproductive/Obstetrics negative OB ROS                             Anesthesia Physical Anesthesia Plan  ASA: III  Anesthesia Plan: General   Post-op Pain Management:    Induction: Intravenous  Airway Management Planned: Oral ETT  Additional Equipment:   Intra-op Plan:   Post-operative Plan: Extubation in OR  Informed Consent: I have reviewed the patients History and Physical, chart, labs and discussed the procedure including the risks, benefits and alternatives for the proposed anesthesia with the patient or authorized representative who has indicated his/her understanding and acceptance.   Dental advisory given  Plan Discussed with: CRNA  Anesthesia Plan Comments:         Anesthesia Quick Evaluation

## 2014-11-07 NOTE — Anesthesia Procedure Notes (Signed)
Procedure Name: Intubation Date/Time: 11/07/2014 1:49 PM Performed by: Glynn Octave E Pre-anesthesia Checklist: Patient identified, Patient being monitored, Timeout performed, Emergency Drugs available and Suction available Patient Re-evaluated:Patient Re-evaluated prior to inductionOxygen Delivery Method: Circle System Utilized Preoxygenation: Pre-oxygenation with 100% oxygen Intubation Type: IV induction Ventilation: Mask ventilation without difficulty Laryngoscope Size: Mac and 3 Grade View: Grade I Tube type: Oral Tube size: 7.0 mm Number of attempts: 1 Airway Equipment and Method: Stylet Placement Confirmation: ETT inserted through vocal cords under direct vision,  positive ETCO2 and breath sounds checked- equal and bilateral Secured at: 21 cm Tube secured with: Tape Dental Injury: Teeth and Oropharynx as per pre-operative assessment

## 2014-11-07 NOTE — H&P (Signed)
History and Physical  Sheryl [name] is a 34 y.o. g1p1. with Patient's last menstrual period was 10/13/2014. admitted for a symptomatic anemia and menometrorrhagia for 3 weeks. Has history of heavy cycles but never like this, came in dizzy light headed and SOB.  Hgb 6.5, admitted for bleeding management with premarin and megestrol and transfusion of PRBC total of 3 units.  Discussed options with patient and will proceed with hysteroscopy uterine curettage endometrial ablation per pt decision of the options of conservative management   PMH:    Past Medical History  Diagnosis Date  . Hypertension     PSH:    History reviewed. No pertinent past surgical history.  POb/GynH:      OB History    No data available      SH:   Social History  Substance Use Topics  . Smoking status: Never Smoker   . Smokeless tobacco: None  . Alcohol Use: No    FH:   History reviewed. No pertinent family history.   Allergies: No Known Allergies  Medications:       Current facility-administered medications:  .  [MAR Hold] conjugated estrogens (PREMARIN) injection 25 mg, 25 mg, Intravenous, Q6H, Lazaro Arms, MD, 25 mg at 11/07/14 0630 .  [MAR Hold] ketorolac (TORADOL) injection 60 mg, 60 mg, Intramuscular, Once, Mancel Bale, MD, 60 mg at 11/05/14 0800 .  lactated ringers infusion, , Intravenous, Continuous, Benita Stabile, MD, Last Rate: 75 mL/hr at 11/07/14 1237 .  [MAR Hold] megestrol (MEGACE) tablet 120 mg, 120 mg, Oral, Daily, Lazaro Arms, MD, 120 mg at 11/06/14 1036 .  midazolam (VERSED) injection 1-2 mg, 1-2 mg, Intravenous, Q5 min PRN, Benita Stabile, MD, 2 mg at 11/07/14 1238 .  scopolamine (TRANSDERM-SCOP) 1 MG/3DAYS 1.5 mg, 1 patch, Transdermal, Once, Benita Stabile, MD, 1.5 mg at 11/07/14 1239  Review of Systems:   Review of Systems  Constitutional: Negative for fever, chills, weight loss, malaise/fatigue and diaphoresis.  HENT: Negative for hearing loss, ear pain,  nosebleeds, congestion, sore throat, neck pain, tinnitus and ear discharge.   Eyes: Negative for blurred vision, double vision, photophobia, pain, discharge and redness.  Respiratory: Negative for cough, hemoptysis, sputum production, shortness of breath, wheezing and stridor.   Cardiovascular: Negative for chest pain, palpitations, orthopnea, claudication, leg swelling and PND.  Gastrointestinal: Positive for abdominal pain. Negative for heartburn, nausea, vomiting, diarrhea, constipation, blood in stool and melena.  Genitourinary: Negative for dysuria, urgency, frequency, hematuria and flank pain.  Musculoskeletal: Negative for myalgias, back pain, joint pain and falls.  Skin: Negative for itching and rash.  Neurological: Negative for dizziness, tingling, tremors, sensory change, speech change, focal weakness, seizures, loss of consciousness, weakness and headaches.  Endo/Heme/Allergies: Negative for environmental allergies and polydipsia. Does not bruise/bleed easily.  Psychiatric/Behavioral: Negative for depression, suicidal ideas, hallucinations, memory loss and substance abuse. The patient is not nervous/anxious and does not have insomnia.      PHYSICAL EXAM:  Blood pressure 150/92, pulse 88, temperature 98 F (36.7 C), temperature source Oral, resp. rate 20, height  (1.727 m), weight 277 lb (125.646 kg), last menstrual period 10/13/2014, SpO2 96 %.    Vitals reviewed. Constitutional: She is oriented to person, place, and time. She appears well-developed and well-nourished.  HENT:  Head: Normocephalic and atraumatic.  Right Ear: External ear normal.  Left Ear: External ear normal.  Nose: Nose normal.  Mouth/Throat: Oropharynx is clear and moist.  Eyes: Conjunctivae and EOM are normal.  Pupils are equal, round, and reactive to light. Right eye exhibits no discharge. Left eye exhibits no discharge. No scleral icterus.  Neck: Normal range of motion. Neck supple. No tracheal  deviation present. No thyromegaly present.  Cardiovascular: Normal rate, regular rhythm, normal heart sounds and intact distal pulses.  Exam reveals no gallop and no friction rub.   No murmur heard. Respiratory: Effort normal and breath sounds normal. No respiratory distress. She has no wheezes. She has no rales. She exhibits no tenderness.  GI: Soft. Bowel sounds are normal. She exhibits no distension and no mass. There is tenderness. There is no rebound and no guarding.  Genitourinary:       Vulva is normal without lesions Vagina is pink moist without discharge Cervix normal in appearance and  per sonogram Uterus is normal size, contour, position, consistency, mobility, non-tender, ;ast pap smear 2 years or so ago and normal by report Adnexa is negative with normal sized ovaries by sonogram small physiologic cyst Musculoskeletal: Normal range of motion. She exhibits no edema and no tenderness.  Neurological: She is alert and oriented to person, place, and time. She has normal reflexes. She displays normal reflexes. No cranial nerve deficit. She exhibits normal muscle tone. Coordination normal.  Skin: Skin is warm and dry. No rash noted. No erythema. No pallor.  Psychiatric: She has a normal mood and affect. Her behavior is normal. Judgment and thought content normal.    Labs: Results for orders placed or performed during the hospital encounter of 11/05/14 (from the past 336 hour(s))  Basic metabolic panel   Collection Time: 11/05/14  7:37 AM  Result Value Ref Range   Sodium 132 (L) 135 - 145 mmol/L   Potassium 3.8 3.5 - 5.1 mmol/L   Chloride 99 (L) 101 - 111 mmol/L   CO2 25 22 - 32 mmol/L   Glucose, Bld 123 (H) 65 - 99 mg/dL   BUN 9 6 - 20 mg/dL   Creatinine, Ser 1.61 0.44 - 1.00 mg/dL   Calcium 8.5 (L) 8.9 - 10.3 mg/dL   GFR calc non Af Amer >60 >60 mL/min   GFR calc Af Amer >60 >60 mL/min   Anion gap 8 5 - 15  CBC with Differential   Collection Time: 11/05/14  7:37 AM  Result  Value Ref Range   WBC 13.0 (H) 4.0 - 10.5 K/uL   RBC 3.15 (L) 3.87 - 5.11 MIL/uL   Hemoglobin 6.4 (LL) 12.0 - 15.0 g/dL   HCT 09.6 (L) 04.5 - 40.9 %   MCV 71.7 (L) 78.0 - 100.0 fL   MCH 20.3 (L) 26.0 - 34.0 pg   MCHC 28.3 (L) 30.0 - 36.0 g/dL   RDW 81.1 (H) 91.4 - 78.2 %   Platelets 438 (H) 150 - 400 K/uL   Neutrophils Relative % 74 %   Neutro Abs 9.7 (H) 1.7 - 7.7 K/uL   Lymphocytes Relative 18 %   Lymphs Abs 2.4 0.7 - 4.0 K/uL   Monocytes Relative 6 %   Monocytes Absolute 0.8 0.1 - 1.0 K/uL   Eosinophils Relative 1 %   Eosinophils Absolute 0.1 0.0 - 0.7 K/uL   Basophils Relative 0 %   Basophils Absolute 0.0 0.0 - 0.1 K/uL  Sample to Blood Bank   Collection Time: 11/05/14  7:51 AM  Result Value Ref Range   Blood Bank Specimen SAMPLE AVAILABLE FOR TESTING    Sample Expiration 11/08/2014   Type and screen   Collection Time: 11/05/14  7:51 AM  Result Value Ref Range   ABO/RH(D) O NEG    Antibody Screen NEG    Sample Expiration 11/08/2014    Unit Number [id number]    Blood Component Type RED CELLS,LR    Unit division 00    Status of Unit ISSUED,FINAL    Transfusion Status OK TO TRANSFUSE    Crossmatch Result Compatible    Unit Number [id number]    Blood Component Type RED CELLS,LR    Unit division 00    Status of Unit ISSUED,FINAL    Transfusion Status OK TO TRANSFUSE    Crossmatch Result Compatible    Unit Number [id number]    Blood Component Type RED CELLS,LR    Unit division 00    Status of Unit ALLOCATED    Transfusion Status OK TO TRANSFUSE    Crossmatch Result Compatible    Unit Number [id number]    Blood Component Type RED CELLS,LR    Unit division 00    Status of Unit ISSUED,FINAL    Transfusion Status OK TO TRANSFUSE    Crossmatch Result Compatible    Unit Number [id number]    Blood Component Type RED CELLS,LR    Unit division 00    Status of Unit ALLOCATED    Transfusion Status OK TO TRANSFUSE    Crossmatch Result Compatible   Prepare  RBC   Collection Time: 11-30-2014  7:51 AM  Result Value Ref Range   Order Confirmation ORDER PROCESSED BY BLOOD BANK   ABO/Rh   Collection Time: 11/30/2014  7:51 AM  Result Value Ref Range   ABO/RH(D) O NEG   Prepare RBC   Collection Time: 11-30-2014  7:51 AM  Result Value Ref Range   Order Confirmation ORDER PROCESSED BY BLOOD BANK   I-Stat Beta hCG blood, ED ([facility], [facility], [facility] only)   Collection Time: 2014-11-30 10:00 AM  Result Value Ref Range   I-stat hCG, quantitative 8.5 (H) <5 mIU/mL   Comment 3          Hemoglobin and hematocrit, blood   Collection Time: 11/30/14  8:52 PM  Result Value Ref Range   Hemoglobin 8.4 (L) 12.0 - 15.0 g/dL   HCT 51.8 (L) 84.1 - 66.0 %  Hemoglobin and hematocrit, blood   Collection Time: 11/06/14  7:28 AM  Result Value Ref Range   Hemoglobin 9.4 (L) 12.0 - 15.0 g/dL   HCT 63.0 (L) 16.0 - 10.9 %  Surgical pcr screen   Collection Time: 11/07/14 12:46 AM  Result Value Ref Range   MRSA, PCR NEGATIVE NEGATIVE   Staphylococcus aureus NEGATIVE NEGATIVE    EKG: No orders found for this or any previous visit.  Imaging Studies: US Transvaginal Non-ob  30-Nov-2014   CLINICAL DATA:  Menometrorrhagia.  Anemia.  LMP 10/13/2014.  EXAM: TRANSABDOMINAL AND TRANSVAGINAL ULTRASOUND OF PELVIS  TECHNIQUE: Both transabdominal and transvaginal ultrasound examinations of the pelvis were performed. Transabdominal technique was performed for global imaging of the pelvis including uterus, ovaries, adnexal regions, and pelvic cul-de-sac. It was necessary to proceed with endovaginal exam following the transabdominal exam to visualize the endometrium and cystic lesion in right adnexa.  COMPARISON:  None  FINDINGS: Uterus  Measurements: 10.2 x 5.1 x 5.5 cm. Diffusely heterogeneous echotexture of uterine myometrium noted, however no distinct fibroids visualized.  Endometrium  Thickness: 24 mm. Heterogeneous appearance noted, although no definite focal abnormality visualized.  Right ovary   Measurements: 5.5 x 4.5 x 5.3 cm. 3.7 cm benign appearing appearing cyst  noted. No complex cystic or solid masses visualized.  Left ovary  Measurements: 3.1 x 1.9 x 2.4 cm. Normal appearance/no adnexal mass.  Other findings  No free fluid.  IMPRESSION: Diffusely heterogeneous myometrial echotexture, without definite fibroids.  Endometrial thickness measures 24 mm. If bleeding remains unresponsive to hormonal or medical therapy, focal lesion work-up with sonohysterogram or hysteroscopy should be considered. Endometrial biopsy should also be considered in pre-menopausal patients at high risk for endometrial carcinoma. (Ref: Radiological Reasoning: Algorithmic Workup of Abnormal Vaginal Bleeding with Endovaginal Sonography and Sonohysterography. AJR 2008; 478:G95-62).  3.7 cm benign appearing right ovarian cyst.   Electronically Signed   By: Myles Rosenthal M.D.   On: 11/05/2014 12:05   US Pelvis Complete  11/05/2014   CLINICAL DATA:  Menometrorrhagia.  Anemia.  LMP 10/13/2014.  EXAM: TRANSABDOMINAL AND TRANSVAGINAL ULTRASOUND OF PELVIS  TECHNIQUE: Both transabdominal and transvaginal ultrasound examinations of the pelvis were performed. Transabdominal technique was performed for global imaging of the pelvis including uterus, ovaries, adnexal regions, and pelvic cul-de-sac. It was necessary to proceed with endovaginal exam following the transabdominal exam to visualize the endometrium and cystic lesion in right adnexa.  COMPARISON:  None  FINDINGS: Uterus  Measurements: 10.2 x 5.1 x 5.5 cm. Diffusely heterogeneous echotexture of uterine myometrium noted, however no distinct fibroids visualized.  Endometrium  Thickness: 24 mm. Heterogeneous appearance noted, although no definite focal abnormality visualized.  Right ovary  Measurements: 5.5 x 4.5 x 5.3 cm. 3.7 cm benign appearing appearing cyst noted. No complex cystic or solid masses visualized.  Left ovary  Measurements: 3.1 x 1.9 x 2.4 cm. Normal appearance/no adnexal  mass.  Other findings  No free fluid.  IMPRESSION: Diffusely heterogeneous myometrial echotexture, without definite fibroids.  Endometrial thickness measures 24 mm. If bleeding remains unresponsive to hormonal or medical therapy, focal lesion work-up with sonohysterogram or hysteroscopy should be considered. Endometrial biopsy should also be considered in pre-menopausal patients at high risk for endometrial carcinoma. (Ref: Radiological Reasoning: Algorithmic Workup of Abnormal Vaginal Bleeding with Endovaginal Sonography and Sonohysterography. AJR 2008; 130:Q65-78).  3.7 cm benign appearing right ovarian cyst.   Electronically Signed   By: Myles Rosenthal M.D.   On: 11/05/2014 12:05      Assessment: Menometrorrhagia Anemia, symptomatic, acute on chronic Patient Active Problem List   Diagnosis Date Noted  . Anemia 11/05/2014    Plan: Hysteroscopy uterine curettage endometrial ablation  Kyrstan Gotwalt H 11/07/2014 1:00 PM

## 2014-11-07 NOTE — Progress Notes (Signed)
Pt's IV catheter removed and intact. Pt's IV site clean dry and intact. Discharge instructions, follow up appointments and medications reviewed and discussed with patient. All questions were answered and no further questions at this time. Pt in stable condition at time of discharge and in no acute distress. Pt escorted by nurse tech.  

## 2014-11-07 NOTE — Transfer of Care (Signed)
Immediate Anesthesia Transfer of Care Note  Patient: Sheryl [name]  Procedure(s) Performed: Procedure(s): DILATATION & CURETTAGE/HYSTEROSCOPY WITH NOVASURE ABLATION (N/A)  Patient Location: PACU  Anesthesia Type:General  Level of Consciousness: awake, alert  and oriented  Airway & Oxygen Therapy: Patient Spontanous Breathing and Patient connected to face mask oxygen  Post-op Assessment: Report given to RN  Post vital signs: Reviewed and stable  Last Vitals:  Filed Vitals:   11/07/14 1335  BP: 160/103  Pulse:   Temp:   Resp: 24    Complications: No apparent anesthesia complications

## 2014-11-07 NOTE — Addendum Note (Signed)
Addendum  created 11/07/14 1627 by Benita Stabile, MD   Modules edited: Anesthesia Attestations

## 2014-11-08 ENCOUNTER — Encounter (HOSPITAL_COMMUNITY): Payer: Self-pay | Admitting: Obstetrics & Gynecology

## 2014-11-12 ENCOUNTER — Encounter: Payer: Self-pay | Admitting: Obstetrics & Gynecology

## 2014-11-15 ENCOUNTER — Encounter: Payer: Self-pay | Admitting: Obstetrics & Gynecology

## 2014-11-15 ENCOUNTER — Ambulatory Visit (INDEPENDENT_AMBULATORY_CARE_PROVIDER_SITE_OTHER): Payer: Federal, State, Local not specified - PPO | Admitting: Obstetrics & Gynecology

## 2014-11-15 VITALS — BP 118/70 | HR 72 | Wt 274.0 lb

## 2014-11-15 DIAGNOSIS — N939 Abnormal uterine and vaginal bleeding, unspecified: Secondary | ICD-10-CM

## 2014-11-15 DIAGNOSIS — R739 Hyperglycemia, unspecified: Secondary | ICD-10-CM

## 2014-11-15 DIAGNOSIS — Z9889 Other specified postprocedural states: Secondary | ICD-10-CM | POA: Diagnosis not present

## 2014-11-15 LAB — POCT HEMOGLOBIN: Hemoglobin: 10.4 g/dL — AB (ref 12.2–16.2)

## 2014-11-15 NOTE — Progress Notes (Signed)
Patient ID: Marin RobertsShannon Arca, female   DOB: 1980/09/16, 34 y.o.   MRN: [id number]  HPI: Patient returns for routine postoperative follow-up having undergone hysteroscopy uterine curettage endometrial ablation and transfusion of 3 units on 11/07/2014.  The patient's immediate postoperative recovery has been unremarkable. Since hospital discharge the patient reports no problems.   Current Outpatient Prescriptions: bisoprolol-hydrochlorothiazide (ZIAC) 2.5-6.25 MG tablet, Take 3 tablets by mouth daily. , Disp: , Rfl:  HYDROcodone-acetaminophen (NORCO/VICODIN) 5-325 MG tablet, Take 1 tablet by mouth every 6 (six) hours as needed., Disp: 30 tablet, Rfl: 0 ketorolac (TORADOL) 10 MG tablet, Take 1 tablet (10 mg total) by mouth every 8 (eight) hours as needed., Disp: 15 tablet, Rfl: 0 ondansetron (ZOFRAN) 8 MG tablet, Take 1 tablet (8 mg total) by mouth every 8 (eight) hours as needed for nausea., Disp: 12 tablet, Rfl: 0  No current facility-administered medications for this visit.    Blood pressure 118/70, pulse 72, weight 274 lb (124.286 kg), last menstrual period 10/13/2014.  Physical Exam: intertrigenous yeast Normal pelvic exam  Diagnostic Tests: none  Pathology: benign  Impression: S/P ablation and transfusion for menometrorrhagia  Plan: Check HgB a1C  Follow up: prn    gemtian violet placed at the sites of yeast  Lazaro ArmsEURE,Shalina Norfolk H, MD

## 2014-11-16 LAB — HEMOGLOBIN A1C
Est. average glucose Bld gHb Est-mCnc: 111 mg/dL
HEMOGLOBIN A1C: 5.5 % (ref 4.8–5.6)

## 2014-11-28 NOTE — Op Note (Signed)
Preoperative diagnosis: Menometrorrhagia                                        Dysmenorrhea                                        Anemia requiring multiple transfusions, x3  Postoperative diagnoses: Same as above   Procedure: Hysteroscopy, uterine curettage, endometrial ablation using Novasure  Surgeon: Lazaro ArmsEURE,LUTHER H   Anesthesia: Laryngeal mask airway  Findings: The endometrium was significant for small endometrial polyp. There were no fibroid or other abnormalities.  Description of operation: The patient was taken to the operating room and placed in the supine position. She underwent general anesthesia using the laryngeal mask airway. She was placed in the dorsal lithotomy position and prepped and draped in the usual sterile fashion. A Graves speculum was placed and the anterior cervical lip was grasped with a single-tooth tenaculum. The cervix was dilated serially to allow passage of the hysteroscope. Diagnostic hysteroscopy was performed and was found to be normal. A vigorous uterine curettage was then performed and all tissue sent to pathology for evaluation.  I then proceeded to perform the Novasure endometrial ablation.  The cervical length was 3.0. The uterus sounded to  8.5 cm yielding a net length of 5.5 cm.  The endometrial cavity was 4.0 cm wide. The power was 126 watts.  The total time of therapy was 1 min 20 seconds. The array was evaluated after the procedure and tissue was adherent on all the dimensions of the surface, confirming fundal treatment as well.    All of the equipment worked well throughout the procedure.  The patient was awakened from anesthesia and taken to the recovery room in good stable condition all counts were correct. She received 2 g of Ancef and 30 mg of Toradol preoperatively. She will be discharged from the recovery room and followed up in the office in 1- 2 weeks.  EURE,LUTHER H

## 2014-11-28 NOTE — Discharge Summary (Signed)
Physician Discharge Summary  Patient ID: Sheryl RobertsShannon [name] MRN: [id number] DOB/AGE: 06/03/1980 34 y.o.  Admit date: 11/05/2014 Discharge date: 11/28/2014  Admission Diagnoses: Menometrorrhagia Symptomatic anemia requiring transfusion  Discharge Diagnoses:  Active Problems:   Anemia s/p hysteroscopy uterine curettage endometrial ablation  Discharged Condition: good  Hospital Course: Sheryl RobertsShannon Lebeau is a 34 y.o. g1p1. with Patient's last menstrual period was 10/13/2014. admitted for a symptomatic anemia and menometrorrhagia for 3 weeks. Has history of heavy cycles but never like this, came in dizzy light headed and SOB. Hgb 6.5, admitted for bleeding management with premarin and megestrol and transfusion of PRBC total of 3 units. Discussed options with patient and will proceed with hysteroscopy uterine curettage endometrial ablation per pt decision of the options of conservative management  Surgery was unremarkable and pt went home from PACU  Consults: None  Significant Diagnostic Studies:   Treatments: surgery:  and transfusion 3 units PRBC, premarin and megestrol therapy  Discharge Exam: Blood pressure 149/77, pulse 95, temperature 98.4 F (36.9 C), temperature source Oral, resp. rate 16, height 5\' 8"  (1.727 m), weight 277 lb (125.646 kg), last menstrual period 10/13/2014, SpO2 96 %. General appearance: alert, cooperative and no distress GI: soft, non-tender; bowel sounds normal; no masses,  no organomegaly  Disposition: 01-Home or Self Care  Discharge Instructions    Diet - low sodium heart healthy    Complete by:  As directed      Driving Restrictions    Complete by:  As directed   No driving for 24 hours     Increase activity slowly    Complete by:  As directed      Lifting restrictions    Complete by:  As directed   No restrictions     No wound care    Complete by:  As directed      Sexual Activity Restrictions    Complete by:  As directed   No sex for 3 weeks            Medication List    STOP taking these medications        ibuprofen 200 MG tablet  Commonly known as:  ADVIL,MOTRIN     IRON PO      TAKE these medications        bisoprolol-hydrochlorothiazide 2.5-6.25 MG tablet  Commonly known as:  ZIAC  Take 3 tablets by mouth daily.     HYDROcodone-acetaminophen 5-325 MG tablet  Commonly known as:  NORCO/VICODIN  Take 1 tablet by mouth every 6 (six) hours as needed.     ketorolac 10 MG tablet  Commonly known as:  TORADOL  Take 1 tablet (10 mg total) by mouth every 8 (eight) hours as needed.     ondansetron 8 MG tablet  Commonly known as:  ZOFRAN  Take 1 tablet (8 mg total) by mouth every 8 (eight) hours as needed for nausea.           Follow-up Information    Follow up with Lazaro ArmsEURE,Thayne Cindric H, MD In 1 week.   Specialties:  Obstetrics and Gynecology, Radiology   Why:  post op visit   Contact information:   [address] [place] [address] [phone]       Signed: Lazaro ArmsURE,Melony Tenpas H

## 2015-11-11 ENCOUNTER — Ambulatory Visit (INDEPENDENT_AMBULATORY_CARE_PROVIDER_SITE_OTHER): Payer: Federal, State, Local not specified - PPO | Admitting: Adult Health

## 2015-11-11 ENCOUNTER — Encounter: Payer: Self-pay | Admitting: Adult Health

## 2015-11-11 ENCOUNTER — Other Ambulatory Visit (HOSPITAL_COMMUNITY)
Admission: RE | Admit: 2015-11-11 | Discharge: 2015-11-11 | Disposition: A | Discharge status: Home / Self Care | Payer: Federal, State, Local not specified - PPO | Source: Ambulatory Visit | Attending: Adult Health | Admitting: Adult Health

## 2015-11-11 VITALS — BP 120/70 | HR 64 | Ht 65.5 in | Wt 248.5 lb

## 2015-11-11 DIAGNOSIS — Z23 Encounter for immunization: Secondary | ICD-10-CM | POA: Diagnosis not present

## 2015-11-11 DIAGNOSIS — G43909 Migraine, unspecified, not intractable, without status migrainosus: Secondary | ICD-10-CM | POA: Diagnosis not present

## 2015-11-11 DIAGNOSIS — Z1151 Encounter for screening for human papillomavirus (HPV): Secondary | ICD-10-CM | POA: Diagnosis not present

## 2015-11-11 DIAGNOSIS — Z01419 Encounter for gynecological examination (general) (routine) without abnormal findings: Secondary | ICD-10-CM | POA: Diagnosis not present

## 2015-11-11 DIAGNOSIS — I10 Essential (primary) hypertension: Secondary | ICD-10-CM | POA: Diagnosis not present

## 2015-11-11 NOTE — Progress Notes (Signed)
Patient ID: Marin RobertsShannon Reetz, female   DOB: 1980/12/27, 35 y.o.   MRN: [id number] History of Present Illness: Carollee HerterShannon is a 35 year old white female in for well woman gyn exam and pap. PCP is Lovenia KimMark Hepler, PA at [facility], where she had labs and flu shot today.   Current Medications, Allergies, Past Medical History, Past Surgical History, Family History and Social History were reviewed in [facility] electronic medical record.     Review of Systems: Patient denies any headaches, hearing loss, fatigue, blurred vision, shortness of breath, chest pain, abdominal pain, problems with bowel movements, urination, or intercourse. 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     Physical Exam:BP 120/70 (BP Location: Left Arm, Patient Position: Sitting, Cuff Size: Large)   Pulse 64   Ht 5' 5.5" (1.664 m)   Wt 248 lb 8 oz (112.7 kg)   LMP 11/02/2015 (Approximate)   BMI 40.72 kg/m  General:  Well developed, well nourished, no acute distress Skin:  Warm and dry Neck:  Midline trachea, normal thyroid, good ROM, no lymphadenopathy Lungs; Clear to auscultation bilaterally Breast:  No dominant palpable mass, retraction, or nipple discharge Cardiovascular: Regular rate and rhythm Abdomen:  Soft, non tender, no hepatosplenomegaly Pelvic:  External genitalia is normal in appearance, has sebaceous cyst left labia.  The vagina is normal in appearance. Urethra has no lesions or masses. The cervix is bulbous,Pap with HPV performed.  Uterus is felt to be normal size, shape, and contour.  No adnexal masses or tenderness noted.Bladder is non tender, no masses felt. Extremities/musculoskeletal:  No swelling or varicosities noted, no clubbing or cyanosis Psych:  No mood changes, alert and cooperative,seems happy PHQ 2 score 0.  Impression: 1. Encounter for gynecological examination  with Papanicolaou smear of cervix       Plan: Physical in 1 year, pap in 3 if normal Mammogram at 39

## 2015-11-11 NOTE — Patient Instructions (Signed)
Physical in 1 year,pap in 3 if normal Mammogram at 39  

## 2015-11-13 LAB — CYTOLOGY - PAP

## 2016-08-03 DIAGNOSIS — R0683 Snoring: Secondary | ICD-10-CM | POA: Diagnosis not present

## 2016-08-03 DIAGNOSIS — Z6836 Body mass index (BMI) 36.0-36.9, adult: Secondary | ICD-10-CM | POA: Diagnosis not present

## 2016-08-03 DIAGNOSIS — J358 Other chronic diseases of tonsils and adenoids: Secondary | ICD-10-CM | POA: Diagnosis not present

## 2016-08-03 DIAGNOSIS — E663 Overweight: Secondary | ICD-10-CM | POA: Diagnosis not present

## 2016-09-11 NOTE — H&P (Signed)
HPI:   Sheryl RobertsShannon [name] is a 36 y.o. female who presents as a consult Patient.   Referring Provider: Shea EvansHepler, Sheryl Ellis, PA-C  Chief complaint: Tonsil stones.  HPI: She has had trouble for about 15 years with tonsil stones. She has been using Q-tips to clean them out. She has not had great success. She has chronic problems with her breath and has intermittent sore throat. She denies heartburn but she does have some choking spells at night sometimes. She drinks 2 cups of coffee daily and enjoys chocolate and peppermint. She is a chronic snorer but does not have any evidence of sleep apnea.  PMH/Meds/All/SocHx/FamHx/ROS:   Past Medical History:  Diagnosis Date  . Headache  . Hypertension   History reviewed. No pertinent surgical history.  No family history of bleeding disorders, wound healing problems or difficulty with anesthesia.   Social History   Social History  . Marital status: Divorced  Spouse name: N/A  . Number of children: N/A  . Years of education: N/A   Occupational History  . Not on file.   Social History Main Topics  . Smoking status: Never Smoker  . Smokeless tobacco: Never Used  . Alcohol use No  . Drug use: No  . Sexual activity: Not on file   Other Topics Concern  . Not on file   Social History Narrative  . No narrative on file   Current Outpatient Prescriptions:  . bisoprolol-hydrochlorothiazide (ZIAC) 2.5-6.25 mg per tablet, , Disp: , Rfl:   A complete ROS was performed with pertinent positives/negatives noted in the HPI. The remainder of the ROS are negative.   Physical Exam:   BP 130/71  Ht 1.727 m (5\' 8" )  Wt 108.9 kg (240 lb)  BMI 36.49 kg/m   General: Heavyset young lady, in no distress, breathing easily. Normal affect. In a pleasant mood. Head: Normocephalic, atraumatic. No masses, or scars. Eyes: Pupils are equal, and reactive to light. Vision is grossly intact. No spontaneous or gaze nystagmus. Ears: Ear canals are clear. Tympanic  membranes are intact, with normal landmarks and the middle ears are clear and healthy. Hearing: Grossly normal. Nose: Nasal cavities are clear with healthy mucosa, no polyps or exudate.Airways are patent. Face: No masses or scars, facial nerve function is symmetric. Oral Cavity: No mucosal abnormalities are noted. Tongue with normal mobility. Dentition appears healthy. Oropharynx: Tonsils are symmetric, 2+ enlarged with cryptic spaces but no visible debris today. There are no mucosal masses identified. Tongue base appears normal and healthy. Larynx/Hypopharynx: deferred Chest: Deferred Neck: No palpable masses, no cervical adenopathy, no thyroid nodules or enlargement. Neuro: Cranial nerves II-XII will normal function. Balance: Normal gate. Other findings: none.  Independent Review of Additional Tests or Records:  none  Procedures:  none  Impression & Plans:  Chronic tonsil stones, chronic snoring. She has failed conservative measures and is very interested in tonsillectomy.Sheryl HerterShannon meets the indications for tonsillectomy. Risks and benefits were discussed in detail. All questions were answered. A handout was provided with additional details. We discussed causes of reflux, including lifestyle and dietary factors. Recommend strict avoidance of all tobacco, caffeine, alcohol, chocolate and peppermint. A reflux handout with more detailed instructions was provided to the patient.

## 2016-09-15 ENCOUNTER — Encounter (HOSPITAL_BASED_OUTPATIENT_CLINIC_OR_DEPARTMENT_OTHER): Payer: Self-pay | Admitting: *Deleted

## 2016-09-15 NOTE — Progress Notes (Signed)
Plans to come tomorrow or Thursday for BMET and EKG. Bring all medications.

## 2016-09-17 ENCOUNTER — Encounter (HOSPITAL_BASED_OUTPATIENT_CLINIC_OR_DEPARTMENT_OTHER)
Admission: RE | Admit: 2016-09-17 | Discharge: 2016-09-17 | Disposition: A | Discharge status: Home / Self Care | Payer: Federal, State, Local not specified - PPO | Source: Ambulatory Visit | Attending: Otolaryngology | Admitting: Otolaryngology

## 2016-09-17 DIAGNOSIS — J358 Other chronic diseases of tonsils and adenoids: Secondary | ICD-10-CM | POA: Diagnosis not present

## 2016-09-17 DIAGNOSIS — Z6838 Body mass index (BMI) 38.0-38.9, adult: Secondary | ICD-10-CM | POA: Diagnosis not present

## 2016-09-17 DIAGNOSIS — K219 Gastro-esophageal reflux disease without esophagitis: Secondary | ICD-10-CM | POA: Diagnosis not present

## 2016-09-17 DIAGNOSIS — R51 Headache: Secondary | ICD-10-CM | POA: Diagnosis not present

## 2016-09-17 DIAGNOSIS — I10 Essential (primary) hypertension: Secondary | ICD-10-CM | POA: Diagnosis not present

## 2016-09-17 DIAGNOSIS — E669 Obesity, unspecified: Secondary | ICD-10-CM | POA: Diagnosis not present

## 2016-09-17 DIAGNOSIS — Z79899 Other long term (current) drug therapy: Secondary | ICD-10-CM | POA: Diagnosis not present

## 2016-09-17 LAB — BASIC METABOLIC PANEL
ANION GAP: 8 (ref 5–15)
BUN: 14 mg/dL (ref 6–20)
CHLORIDE: 105 mmol/L (ref 101–111)
CO2: 27 mmol/L (ref 22–32)
Calcium: 9.3 mg/dL (ref 8.9–10.3)
Creatinine, Ser: 0.76 mg/dL (ref 0.44–1.00)
GFR calc Af Amer: >60 mL/min (ref >60)
GFR calc non Af Amer: >60 mL/min (ref >60)
GLUCOSE: 107 mg/dL — AB (ref 65–99)
POTASSIUM: 4.1 mmol/L (ref 3.5–5.1)
Sodium: 140 mmol/L (ref 135–145)

## 2016-09-21 ENCOUNTER — Ambulatory Visit (HOSPITAL_BASED_OUTPATIENT_CLINIC_OR_DEPARTMENT_OTHER)
Admission: RE | Admit: 2016-09-21 | Discharge: 2016-09-21 | Disposition: A | Discharge status: Home / Self Care | Payer: Federal, State, Local not specified - PPO | Source: Ambulatory Visit | Attending: Otolaryngology | Admitting: Otolaryngology

## 2016-09-21 ENCOUNTER — Ambulatory Visit (HOSPITAL_BASED_OUTPATIENT_CLINIC_OR_DEPARTMENT_OTHER): Payer: Federal, State, Local not specified - PPO | Admitting: Certified Registered"

## 2016-09-21 ENCOUNTER — Encounter (HOSPITAL_BASED_OUTPATIENT_CLINIC_OR_DEPARTMENT_OTHER): Payer: Self-pay | Admitting: Certified Registered"

## 2016-09-21 ENCOUNTER — Encounter (HOSPITAL_BASED_OUTPATIENT_CLINIC_OR_DEPARTMENT_OTHER)
Admission: RE | Disposition: A | Discharge status: Home / Self Care | Payer: Self-pay | Source: Ambulatory Visit | Attending: Otolaryngology

## 2016-09-21 DIAGNOSIS — J358 Other chronic diseases of tonsils and adenoids: Secondary | ICD-10-CM | POA: Insufficient documentation

## 2016-09-21 DIAGNOSIS — D649 Anemia, unspecified: Secondary | ICD-10-CM | POA: Diagnosis not present

## 2016-09-21 DIAGNOSIS — Z9089 Acquired absence of other organs: Secondary | ICD-10-CM

## 2016-09-21 DIAGNOSIS — R51 Headache: Secondary | ICD-10-CM | POA: Diagnosis not present

## 2016-09-21 DIAGNOSIS — Z79899 Other long term (current) drug therapy: Secondary | ICD-10-CM | POA: Insufficient documentation

## 2016-09-21 DIAGNOSIS — K219 Gastro-esophageal reflux disease without esophagitis: Secondary | ICD-10-CM | POA: Diagnosis not present

## 2016-09-21 DIAGNOSIS — Z6838 Body mass index (BMI) 38.0-38.9, adult: Secondary | ICD-10-CM | POA: Insufficient documentation

## 2016-09-21 DIAGNOSIS — I10 Essential (primary) hypertension: Secondary | ICD-10-CM | POA: Diagnosis not present

## 2016-09-21 DIAGNOSIS — E669 Obesity, unspecified: Secondary | ICD-10-CM | POA: Diagnosis not present

## 2016-09-21 HISTORY — PX: TONSILLECTOMY: SHX5217

## 2016-09-21 SURGERY — TONSILLECTOMY
Anesthesia: General | Site: Mouth

## 2016-09-21 MED ORDER — PROMETHAZINE HCL 25 MG RE SUPP: 25.0000 mg | Freq: Four times a day (QID) | RECTAL | Status: DC | PRN

## 2016-09-21 MED ORDER — DEXAMETHASONE SODIUM PHOSPHATE 4 MG/ML IJ SOLN
INTRAMUSCULAR | Status: DC | PRN
  Administered 2016-09-21: 10 mg via INTRAVENOUS

## 2016-09-21 MED ORDER — ONDANSETRON HCL 4 MG/2ML IJ SOLN
INTRAMUSCULAR | Status: DC | PRN
  Administered 2016-09-21: 4 mg via INTRAVENOUS

## 2016-09-21 MED ORDER — MIDAZOLAM HCL 2 MG/2ML IJ SOLN
1.0000 mg | INTRAMUSCULAR | Status: DC | PRN
  Administered 2016-09-21: 2 mg via INTRAVENOUS

## 2016-09-21 MED ORDER — PROPOFOL 10 MG/ML IV BOLUS
INTRAVENOUS | Status: DC | PRN
  Administered 2016-09-21: 150 mg via INTRAVENOUS

## 2016-09-21 MED ORDER — PROPOFOL 500 MG/50ML IV EMUL
INTRAVENOUS | Status: AC
  Filled 2016-09-21: qty 50

## 2016-09-21 MED ORDER — SUCCINYLCHOLINE CHLORIDE 20 MG/ML IJ SOLN
INTRAMUSCULAR | Status: DC | PRN
  Administered 2016-09-21: 100 mg via INTRAVENOUS

## 2016-09-21 MED ORDER — SCOPOLAMINE 1 MG/3DAYS TD PT72
1.0000 | MEDICATED_PATCH | Freq: Once | TRANSDERMAL | Status: DC | PRN
  Administered 2016-09-21: 1.5 mg via TRANSDERMAL

## 2016-09-21 MED ORDER — DEXAMETHASONE SODIUM PHOSPHATE 10 MG/ML IJ SOLN
INTRAMUSCULAR | Status: AC
  Filled 2016-09-21: qty 1

## 2016-09-21 MED ORDER — LIDOCAINE 2% (20 MG/ML) 5 ML SYRINGE
INTRAMUSCULAR | Status: DC | PRN
  Administered 2016-09-21: 60 mg via INTRAVENOUS

## 2016-09-21 MED ORDER — HYDROCODONE-ACETAMINOPHEN 7.5-325 MG/15ML PO SOLN
10.0000 mL | ORAL | Status: DC | PRN
  Administered 2016-09-21: 15 mL via ORAL
  Filled 2016-09-21: qty 15

## 2016-09-21 MED ORDER — HYDROCODONE-ACETAMINOPHEN 7.5-325 MG/15ML PO SOLN
15.0000 mL | Freq: Four times a day (QID) | ORAL | 0 refills | Status: DC | PRN
Start: 2016-09-21 — End: 2018-09-15

## 2016-09-21 MED ORDER — MEPERIDINE HCL 25 MG/ML IJ SOLN: 6.2500 mg | INTRAMUSCULAR | Status: DC | PRN

## 2016-09-21 MED ORDER — BACITRACIN ZINC 500 UNIT/GM EX OINT: 1.0000 "application " | TOPICAL_OINTMENT | Freq: Three times a day (TID) | CUTANEOUS | Status: DC

## 2016-09-21 MED ORDER — HYDROMORPHONE HCL 1 MG/ML IJ SOLN
INTRAMUSCULAR | Status: AC
  Filled 2016-09-21: qty 0.5

## 2016-09-21 MED ORDER — PROMETHAZINE HCL 25 MG RE SUPP: 25.0000 mg | Freq: Four times a day (QID) | RECTAL | 1 refills | Status: AC | PRN

## 2016-09-21 MED ORDER — SUMATRIPTAN SUCCINATE 25 MG PO TABS: 25.0000 mg | ORAL_TABLET | ORAL | Status: DC | PRN

## 2016-09-21 MED ORDER — IBUPROFEN 100 MG/5ML PO SUSP
400.0000 mg | Freq: Four times a day (QID) | ORAL | Status: DC | PRN
  Administered 2016-09-21 (×2): 400 mg via ORAL

## 2016-09-21 MED ORDER — LACTATED RINGERS IV SOLN
INTRAVENOUS | Status: DC
  Administered 2016-09-21 (×2): via INTRAVENOUS

## 2016-09-21 MED ORDER — PROMETHAZINE HCL 25 MG PO TABS: 25.0000 mg | ORAL_TABLET | Freq: Four times a day (QID) | ORAL | Status: DC | PRN

## 2016-09-21 MED ORDER — PROMETHAZINE HCL 25 MG/ML IJ SOLN: 6.2500 mg | INTRAMUSCULAR | Status: DC | PRN

## 2016-09-21 MED ORDER — IBUPROFEN 100 MG/5ML PO SUSP
ORAL | Status: AC
  Filled 2016-09-21: qty 20

## 2016-09-21 MED ORDER — OXYCODONE HCL 5 MG/5ML PO SOLN: 5.0000 mg | Freq: Once | ORAL | Status: DC | PRN

## 2016-09-21 MED ORDER — OXYCODONE HCL 5 MG PO TABS: 5.0000 mg | ORAL_TABLET | Freq: Once | ORAL | Status: DC | PRN

## 2016-09-21 MED ORDER — HYDROMORPHONE HCL 1 MG/ML IJ SOLN
0.2500 mg | INTRAMUSCULAR | Status: DC | PRN
  Administered 2016-09-21 (×2): 0.5 mg via INTRAVENOUS

## 2016-09-21 MED ORDER — PHENOL 1.4 % MT LIQD
1.0000 | OROMUCOSAL | Status: DC | PRN
  Administered 2016-09-21: 1 via OROMUCOSAL
  Filled 2016-09-21: qty 177

## 2016-09-21 MED ORDER — DEXTROSE-NACL 5-0.9 % IV SOLN
INTRAVENOUS | Status: DC
  Administered 2016-09-21: 10:00:00 via INTRAVENOUS

## 2016-09-21 MED ORDER — BISOPROLOL-HYDROCHLOROTHIAZIDE 2.5-6.25 MG PO TABS: 3.0000 | ORAL_TABLET | Freq: Every day | ORAL | Status: DC

## 2016-09-21 MED ORDER — FENTANYL CITRATE (PF) 100 MCG/2ML IJ SOLN
INTRAMUSCULAR | Status: AC
  Filled 2016-09-21: qty 2

## 2016-09-21 MED ORDER — FENTANYL CITRATE (PF) 100 MCG/2ML IJ SOLN
50.0000 ug | INTRAMUSCULAR | Status: DC | PRN
  Administered 2016-09-21 (×2): 50 ug via INTRAVENOUS

## 2016-09-21 MED ORDER — ONDANSETRON HCL 4 MG/2ML IJ SOLN
INTRAMUSCULAR | Status: AC
  Filled 2016-09-21: qty 2

## 2016-09-21 MED ORDER — MIDAZOLAM HCL 2 MG/2ML IJ SOLN
INTRAMUSCULAR | Status: AC
  Filled 2016-09-21: qty 2

## 2016-09-21 MED ORDER — SCOPOLAMINE 1 MG/3DAYS TD PT72
MEDICATED_PATCH | TRANSDERMAL | Status: AC
  Filled 2016-09-21: qty 1

## 2016-09-21 MED ORDER — LIDOCAINE 2% (20 MG/ML) 5 ML SYRINGE
INTRAMUSCULAR | Status: AC
  Filled 2016-09-21: qty 5

## 2016-09-21 SURGICAL SUPPLY — 20 items
CANISTER SUCT 1200ML W/VALVE (MISCELLANEOUS) ×2 IMPLANT
CATH ROBINSON RED A/P 12FR (CATHETERS) ×2 IMPLANT
COAGULATOR SUCT SWTCH 10FR 6 (ELECTROSURGICAL) ×2 IMPLANT
COVER BACK TABLE 60X90IN (DRAPES) ×2 IMPLANT
COVER MAYO STAND STRL (DRAPES) ×2 IMPLANT
ELECT COATED BLADE 2.86 ST (ELECTRODE) ×2 IMPLANT
ELECT REM PT RETURN 9FT ADLT (ELECTROSURGICAL) ×2
ELECTRODE REM PT RTRN 9FT ADLT (ELECTROSURGICAL) ×1 IMPLANT
GAUZE SPONGE 4X4 12PLY STRL LF (GAUZE/BANDAGES/DRESSINGS) ×2 IMPLANT
GLOVE BIO SURGEON STRL SZ 6.5 (GLOVE) ×2 IMPLANT
GLOVE ECLIPSE 7.5 STRL STRAW (GLOVE) ×2 IMPLANT
GOWN STRL REUS W/ TWL LRG LVL3 (GOWN DISPOSABLE) ×2 IMPLANT
GOWN STRL REUS W/TWL LRG LVL3 (GOWN DISPOSABLE) ×2
NS IRRIG 1000ML POUR BTL (IV SOLUTION) ×2 IMPLANT
PENCIL FOOT CONTROL (ELECTRODE) ×2 IMPLANT
SHEET MEDIUM DRAPE 40X70 STRL (DRAPES) ×2 IMPLANT
SYR BULB 3OZ (MISCELLANEOUS) ×2 IMPLANT
TOWEL OR 17X24 6PK STRL BLUE (TOWEL DISPOSABLE) ×2 IMPLANT
TUBE CONNECTING 20X1/4 (TUBING) ×2 IMPLANT
TUBE SALEM SUMP 16 FR W/ARV (TUBING) ×2 IMPLANT

## 2016-09-21 NOTE — Anesthesia Preprocedure Evaluation (Signed)
Anesthesia Evaluation  Patient identified by MRN, date of birth, ID band Patient awake    Reviewed: Allergy & Precautions, NPO status , Patient's Chart, lab work & pertinent test results  Airway Mallampati: III  TM Distance: <3 FB Neck ROM: Full  Mouth opening: Limited Mouth Opening  Dental  (+) Chipped, Missing,    Pulmonary neg pulmonary ROS,    Pulmonary exam normal        Cardiovascular hypertension, Pt. on medications Normal cardiovascular exam     Neuro/Psych  Headaches, negative neurological ROS  negative psych ROS   GI/Hepatic Neg liver ROS, GERD  Poorly Controlled,  Endo/Other    Renal/GU negative Renal ROS  negative genitourinary   Musculoskeletal negative musculoskeletal ROS (+)   Abdominal Normal abdominal exam  (+) + obese,   Peds  Hematology   Anesthesia Other Findings Obesity  Reproductive/Obstetrics negative OB ROS                             Anesthesia Physical  Anesthesia Plan  ASA: II  Anesthesia Plan: General   Post-op Pain Management:    Induction: Intravenous  PONV Risk Score and Plan: 3 and Ondansetron, Dexamethasone, Midazolam and Treatment may vary due to age or medical condition  Airway Management Planned: Oral ETT  Additional Equipment:   Intra-op Plan:   Post-operative Plan: Extubation in OR  Informed Consent: I have reviewed the patients History and Physical, chart, labs and discussed the procedure including the risks, benefits and alternatives for the proposed anesthesia with the patient or authorized representative who has indicated his/her understanding and acceptance.   Dental advisory given  Plan Discussed with: CRNA  Anesthesia Plan Comments:         Anesthesia Quick Evaluation

## 2016-09-21 NOTE — Op Note (Signed)
09/21/2016  8:35 AM  PATIENT:  Sheryl [name]  36 y.o. female  PRE-OPERATIVE DIAGNOSIS:  TONSILLAR CALCULUS J35.8  POST-OPERATIVE DIAGNOSIS:  TONSILLAR CALCULUS J35.8  PROCEDURE:  Procedure(s): TONSILLECTOMY  SURGEON:  Surgeon(s): Serena Colonel, MD  ANESTHESIA:   General  COUNTS: Correct   DICTATION: The patient was taken to the operating room and placed on the operating table in the supine position. Following induction of general endotracheal anesthesia, the table was turned and the patient was draped in a standard fashion. A Crowe-Davis mouthgag was inserted into the oral cavity and used to retract the tongue and mandible, then attached to the Mayo stand.  The tonsillectomy was then performed using electrocautery dissection, carefully dissecting the avascular plane between the capsule and constrictor muscles. Cautery was used for completion of hemostasis. The tonsils were moderately enlarged and cryptic with a large amount of debris , and were discarded.  The pharynx was irrigated with saline and suctioned. An oral gastric tube was used to aspirate the contents of the stomach. The patient was then awakened from anesthesia and transferred to PACU in stable condition.   PATIENT DISPOSITION:  To PACA, stable

## 2016-09-21 NOTE — Anesthesia Postprocedure Evaluation (Signed)
Anesthesia Post Note  Patient: Sheryl [name]  Procedure(s) Performed: Procedure(s) (LRB): TONSILLECTOMY (N/A)     Patient location during evaluation: PACU Anesthesia Type: General Level of consciousness: awake and alert Pain management: pain level controlled Vital Signs Assessment: post-procedure vital signs reviewed and stable Respiratory status: spontaneous breathing, nonlabored ventilation and respiratory function stable Cardiovascular status: blood pressure returned to baseline and stable Postop Assessment: no signs of nausea or vomiting Anesthetic complications: no    Last Vitals:  Vitals:   09/21/16 0941 09/21/16 1000  BP:  (!) 150/89  Pulse: (!) 48 (!) 52  Resp: 10 14  Temp:  (!) 36.2 C  SpO2: 91% 93%    Last Pain:  Vitals:   09/21/16 0930  TempSrc:   PainSc: 4                  Lowella Curb

## 2016-09-21 NOTE — Interval H&P Note (Signed)
History and Physical Interval Note:  09/21/2016 7:54 AM  Sheryl [name]  has presented today for surgery, with the diagnosis of TONSILLAR CALCULUS J35.8  The various methods of treatment have been discussed with the patient and family. After consideration of risks, benefits and other options for treatment, the patient has consented to  Procedure(s): TONSILLECTOMY (N/A) as a surgical intervention .  The patient's history has been reviewed, patient examined, no change in status, stable for surgery.  I have reviewed the patient's chart and labs.  Questions were answered to the patient's satisfaction.     Teressa Mcglocklin

## 2016-09-21 NOTE — Discharge Instructions (Signed)

## 2016-09-21 NOTE — Anesthesia Procedure Notes (Signed)
Procedure Name: Intubation Date/Time: 09/21/2016 8:15 AM Performed by: Santanna Whitford D Pre-anesthesia Checklist: Patient identified, Emergency Drugs available, Suction available and Patient being monitored Patient Re-evaluated:Patient Re-evaluated prior to induction Oxygen Delivery Method: Circle system utilized Preoxygenation: Pre-oxygenation with 100% oxygen Induction Type: IV induction Ventilation: Mask ventilation without difficulty Laryngoscope Size: Mac and 3 Grade View: Grade II Tube type: Oral Tube size: 7.0 mm Number of attempts: 1 Airway Equipment and Method: Stylet and Oral airway Placement Confirmation: ETT inserted through vocal cords under direct vision,  positive ETCO2 and breath sounds checked- equal and bilateral Tube secured with: Tape Dental Injury: Teeth and Oropharynx as per pre-operative assessment

## 2016-09-21 NOTE — Transfer of Care (Signed)
Immediate Anesthesia Transfer of Care Note  Patient: Sheryl [name]  Procedure(s) Performed: Procedure(s): TONSILLECTOMY (N/A)  Patient Location: PACU  Anesthesia Type:General  Level of Consciousness: awake, alert , oriented and patient cooperative  Airway & Oxygen Therapy: Patient Spontanous Breathing and Patient connected to face mask oxygen  Post-op Assessment: Report given to RN and Post -op Vital signs reviewed and stable  Post vital signs: Reviewed and stable  Last Vitals:  Vitals:   09/21/16 0639  BP: 112/72  Pulse: (!) 58  Resp: 18  Temp: 36.7 C  SpO2: 98%    Last Pain:  Vitals:   09/21/16 0639  TempSrc: Oral         Complications: No apparent anesthesia complications

## 2016-09-22 ENCOUNTER — Encounter (HOSPITAL_BASED_OUTPATIENT_CLINIC_OR_DEPARTMENT_OTHER): Payer: Self-pay | Admitting: Otolaryngology

## 2016-11-09 DIAGNOSIS — K080 Exfoliation of teeth due to systemic causes: Secondary | ICD-10-CM | POA: Diagnosis not present

## 2016-12-14 DIAGNOSIS — I10 Essential (primary) hypertension: Secondary | ICD-10-CM | POA: Diagnosis not present

## 2016-12-14 DIAGNOSIS — Z23 Encounter for immunization: Secondary | ICD-10-CM | POA: Diagnosis not present

## 2017-06-23 DIAGNOSIS — R591 Generalized enlarged lymph nodes: Secondary | ICD-10-CM | POA: Diagnosis not present

## 2017-06-23 DIAGNOSIS — J029 Acute pharyngitis, unspecified: Secondary | ICD-10-CM | POA: Diagnosis not present

## 2017-07-30 IMAGING — US US PELVIS COMPLETE
2 series · 13 of 25 positions shown · non-contrast
Comparison: None

CLINICAL DATA: Menometrorrhagia.  Anemia.  LMP 10/13/2014.

EXAM:
TRANSABDOMINAL AND TRANSVAGINAL ULTRASOUND OF PELVIS
TECHNIQUE: Both transabdominal and transvaginal ultrasound examinations of the
pelvis were performed. Transabdominal technique was performed for
global imaging of the pelvis including uterus, ovaries, adnexal
regions, and pelvic cul-de-sac. It was necessary to proceed with
endovaginal exam following the transabdominal exam to visualize the
endometrium and cystic lesion in right adnexa.

[Series 1: us pelvis complete · 0.22mm/px · 12 of 205 slices shown (1 of 2)]
[im 1/205]
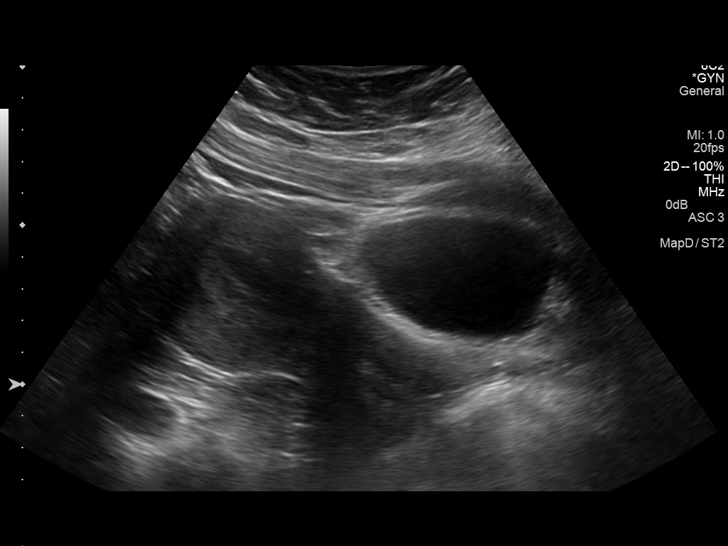
[im 18/205]
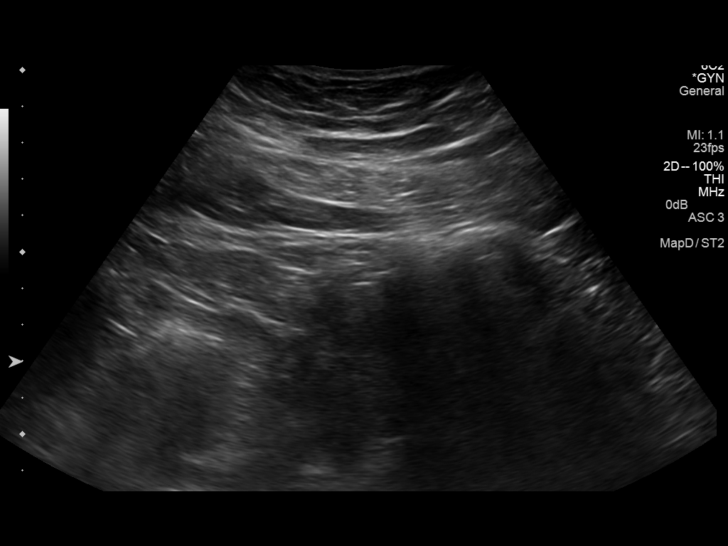
[im 36/205]
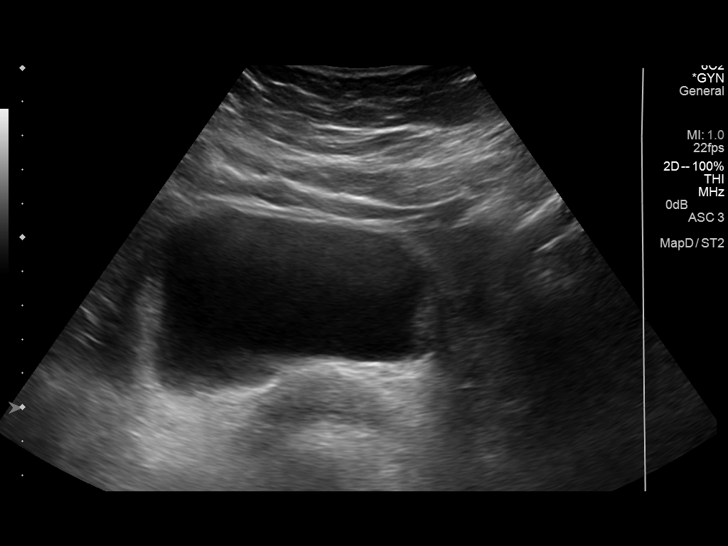
[im 54/205]
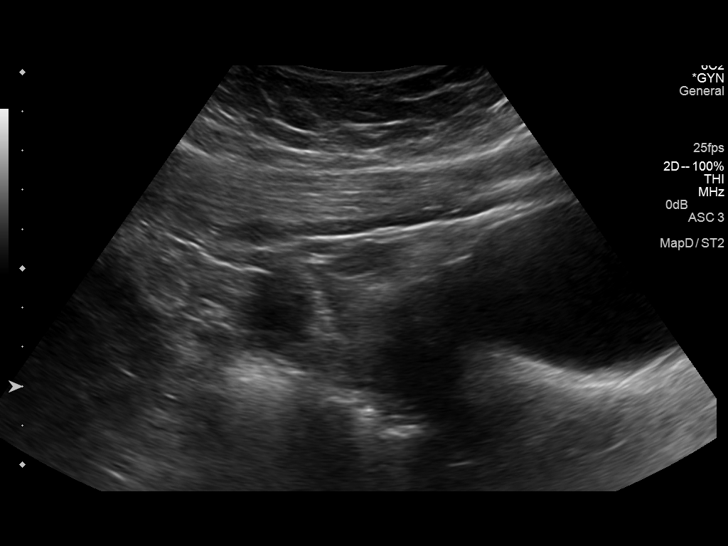
[im 71/205]
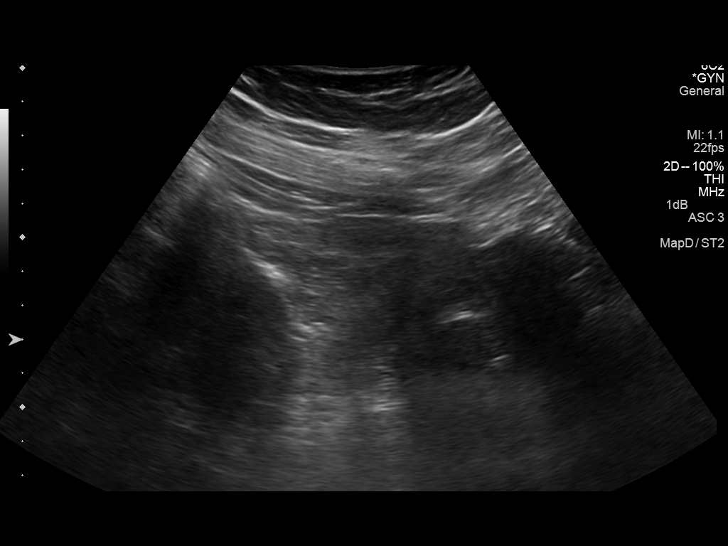
[im 89/205]
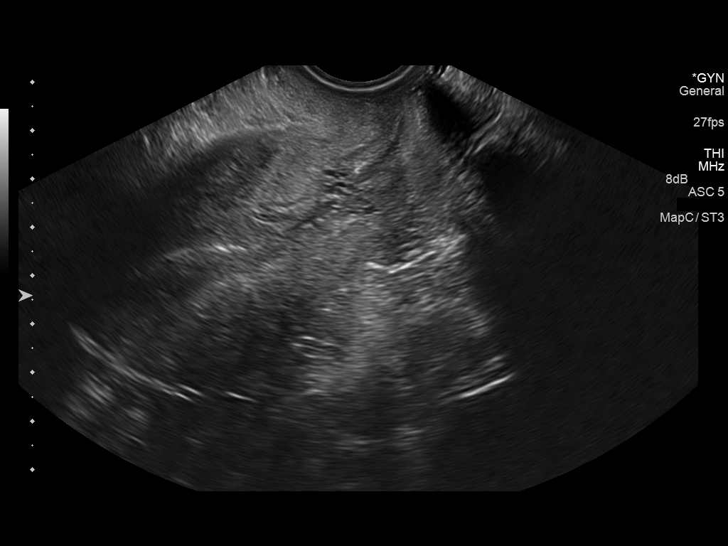
[im 107/205]
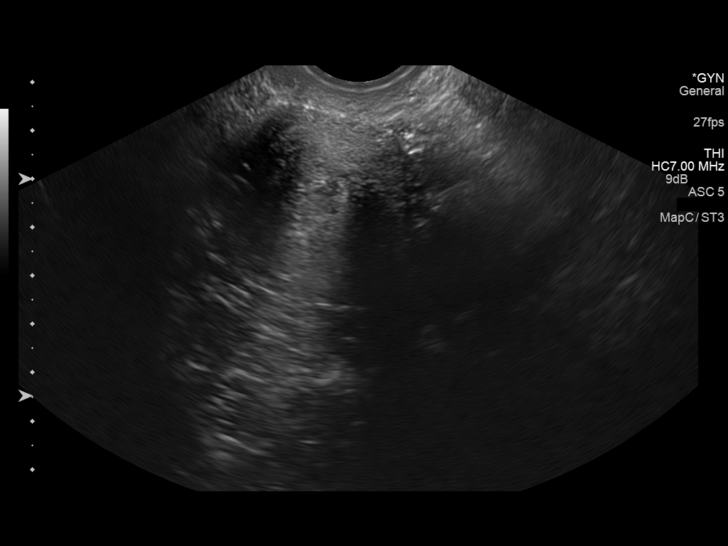
[im 125/205]
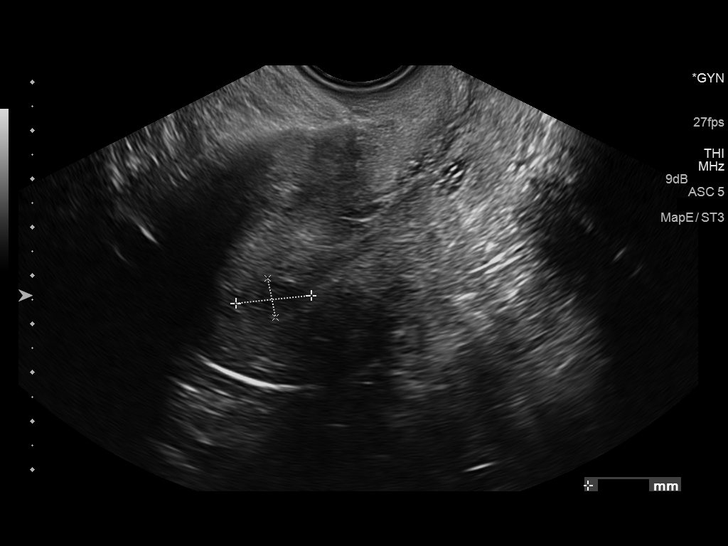
[im 142/205]
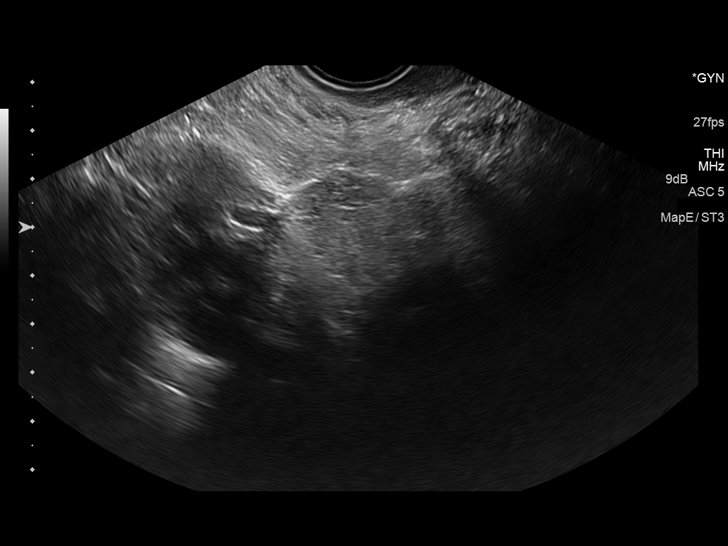
[im 160/205]
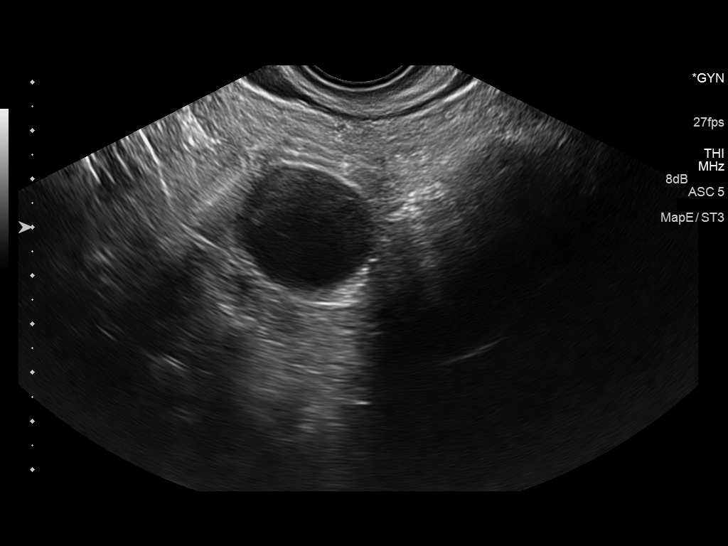
[im 178/205]
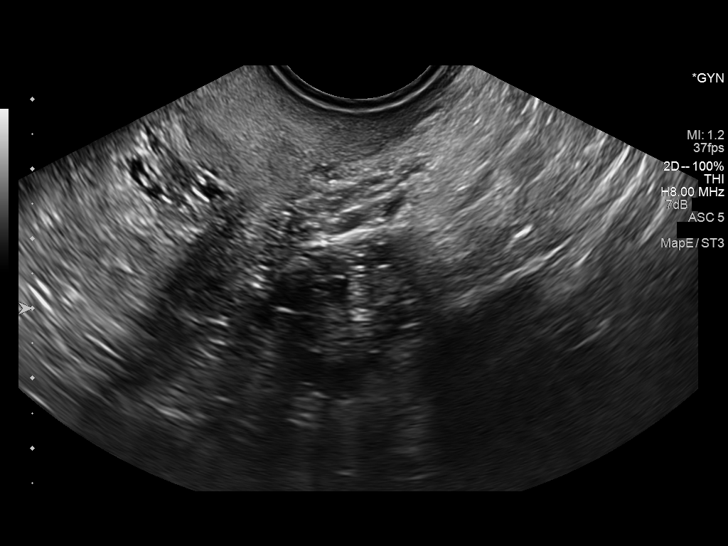
[im 196/205]
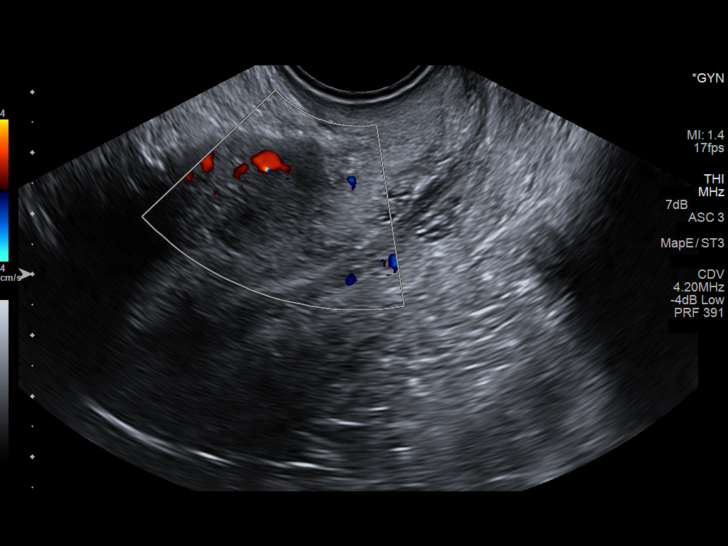

[Series 2: us pelvis complete · 0.15mm/px · 1 of 2 slices shown (2 of 2)]
[im 1/2]
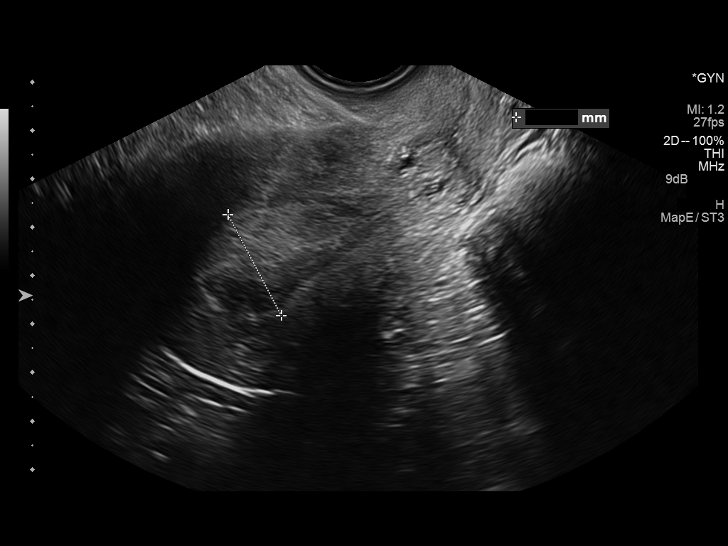

[13 of 25 positions shown; findings below may reference images not displayed]

FINDINGS: Uterus

Measurements: 10.2 x 5.1 x 5.5 cm. Diffusely heterogeneous
echotexture of uterine myometrium noted, however no distinct
fibroids visualized.

Endometrium

Thickness: 24 mm. Heterogeneous appearance noted, although no
definite focal abnormality visualized.

Right ovary

Measurements: 5.5 x 4.5 x 5.3 cm. 3.7 cm benign appearing appearing
cyst noted. No complex cystic or solid masses visualized.

Left ovary

Measurements: 3.1 x 1.9 x 2.4 cm. Normal appearance/no adnexal mass.

Other findings

No free fluid.
IMPRESSION: Diffusely heterogeneous myometrial echotexture, without definite
fibroids.

Endometrial thickness measures 24 mm. If bleeding remains
unresponsive to hormonal or medical therapy, focal lesion work-up
with sonohysterogram or hysteroscopy should be considered.
Endometrial biopsy should also be considered in pre-menopausal
patients at high risk for endometrial carcinoma. (Ref: Radiological
Reasoning: Algorithmic Workup of Abnormal Vaginal Bleeding with
Endovaginal Sonography and Sonohysterography. AJR [id number]; 191:S68-73).

3.7 cm benign appearing right ovarian cyst.

## 2017-11-04 DIAGNOSIS — I10 Essential (primary) hypertension: Secondary | ICD-10-CM | POA: Diagnosis not present

## 2017-11-04 DIAGNOSIS — Z23 Encounter for immunization: Secondary | ICD-10-CM | POA: Diagnosis not present

## 2017-11-04 DIAGNOSIS — G43909 Migraine, unspecified, not intractable, without status migrainosus: Secondary | ICD-10-CM | POA: Diagnosis not present

## 2018-09-06 DIAGNOSIS — R1011 Right upper quadrant pain: Secondary | ICD-10-CM | POA: Diagnosis not present

## 2018-09-07 ENCOUNTER — Other Ambulatory Visit (HOSPITAL_COMMUNITY): Payer: Self-pay | Admitting: Nurse Practitioner

## 2018-09-07 ENCOUNTER — Other Ambulatory Visit: Payer: Self-pay | Admitting: Nurse Practitioner

## 2018-09-07 DIAGNOSIS — R1011 Right upper quadrant pain: Secondary | ICD-10-CM

## 2018-09-14 ENCOUNTER — Other Ambulatory Visit: Payer: Self-pay

## 2018-09-14 ENCOUNTER — Ambulatory Visit (HOSPITAL_COMMUNITY)
Admission: RE | Admit: 2018-09-14 | Discharge: 2018-09-14 | Disposition: A | Discharge status: Home / Self Care | Payer: Federal, State, Local not specified - PPO | Source: Ambulatory Visit | Attending: Nurse Practitioner | Admitting: Nurse Practitioner

## 2018-09-14 ENCOUNTER — Encounter (HOSPITAL_COMMUNITY): Payer: Self-pay

## 2018-09-14 ENCOUNTER — Inpatient Hospital Stay (HOSPITAL_COMMUNITY)
Admission: EM | Admit: 2018-09-14 | Discharge: 2018-09-15 | DRG: 418 | Disposition: A | Discharge status: Home / Self Care | Payer: Federal, State, Local not specified - PPO | Attending: General Surgery | Admitting: General Surgery

## 2018-09-14 DIAGNOSIS — I10 Essential (primary) hypertension: Secondary | ICD-10-CM | POA: Diagnosis present

## 2018-09-14 DIAGNOSIS — K810 Acute cholecystitis: Secondary | ICD-10-CM | POA: Diagnosis not present

## 2018-09-14 DIAGNOSIS — E669 Obesity, unspecified: Secondary | ICD-10-CM | POA: Diagnosis not present

## 2018-09-14 DIAGNOSIS — R1011 Right upper quadrant pain: Secondary | ICD-10-CM

## 2018-09-14 DIAGNOSIS — Z20828 Contact with and (suspected) exposure to other viral communicable diseases: Secondary | ICD-10-CM | POA: Diagnosis not present

## 2018-09-14 DIAGNOSIS — K8000 Calculus of gallbladder with acute cholecystitis without obstruction: Secondary | ICD-10-CM | POA: Diagnosis not present

## 2018-09-14 DIAGNOSIS — Z800 Family history of malignant neoplasm of digestive organs: Secondary | ICD-10-CM

## 2018-09-14 DIAGNOSIS — K821 Hydrops of gallbladder: Secondary | ICD-10-CM | POA: Diagnosis not present

## 2018-09-14 DIAGNOSIS — Z79899 Other long term (current) drug therapy: Secondary | ICD-10-CM

## 2018-09-14 DIAGNOSIS — Z03818 Encounter for observation for suspected exposure to other biological agents ruled out: Secondary | ICD-10-CM | POA: Diagnosis not present

## 2018-09-14 DIAGNOSIS — Z8249 Family history of ischemic heart disease and other diseases of the circulatory system: Secondary | ICD-10-CM

## 2018-09-14 DIAGNOSIS — G43909 Migraine, unspecified, not intractable, without status migrainosus: Secondary | ICD-10-CM | POA: Diagnosis not present

## 2018-09-14 DIAGNOSIS — K760 Fatty (change of) liver, not elsewhere classified: Secondary | ICD-10-CM | POA: Diagnosis not present

## 2018-09-14 DIAGNOSIS — K8020 Calculus of gallbladder without cholecystitis without obstruction: Secondary | ICD-10-CM | POA: Diagnosis not present

## 2018-09-14 HISTORY — DX: Other complications of anesthesia, initial encounter: T88.59XA

## 2018-09-14 HISTORY — DX: Nausea with vomiting, unspecified: R11.2

## 2018-09-14 LAB — PREGNANCY, URINE: Preg Test, Ur: NEGATIVE

## 2018-09-14 LAB — COMPREHENSIVE METABOLIC PANEL
ALT: 16 U/L (ref 0–44)
AST: 15 U/L (ref 15–41)
Albumin: 3.6 g/dL (ref 3.5–5.0)
Alkaline Phosphatase: 69 U/L (ref 38–126)
Anion gap: 11 (ref 5–15)
BUN: 9 mg/dL (ref 6–20)
CO2: 25 mmol/L (ref 22–32)
Calcium: 8.7 mg/dL — ABNORMAL LOW (ref 8.9–10.3)
Chloride: 102 mmol/L (ref 98–111)
Creatinine, Ser: 0.79 mg/dL (ref 0.44–1.00)
GFR calc Af Amer: >60 mL/min (ref >60)
GFR calc non Af Amer: >60 mL/min (ref >60)
Glucose, Bld: 104 mg/dL — ABNORMAL HIGH (ref 70–99)
Potassium: 3.6 mmol/L (ref 3.5–5.1)
Sodium: 138 mmol/L (ref 135–145)
Total Bilirubin: 0.3 mg/dL (ref 0.3–1.2)
Total Protein: 7.4 g/dL (ref 6.5–8.1)

## 2018-09-14 LAB — CBC WITH DIFFERENTIAL/PLATELET
Abs Immature Granulocytes: 0.04 10*3/uL (ref 0.00–0.07)
Basophils Absolute: 0 10*3/uL (ref 0.0–0.1)
Basophils Relative: 1 %
Eosinophils Absolute: 0.3 10*3/uL (ref 0.0–0.5)
Eosinophils Relative: 3 %
HCT: 40.4 % (ref 36.0–46.0)
Hemoglobin: 12.8 g/dL (ref 12.0–15.0)
Immature Granulocytes: 1 %
Lymphocytes Relative: 27 %
Lymphs Abs: 2.3 10*3/uL (ref 0.7–4.0)
MCH: 27.8 pg (ref 26.0–34.0)
MCHC: 31.7 g/dL (ref 30.0–36.0)
MCV: 87.8 fL (ref 80.0–100.0)
Monocytes Absolute: 0.6 10*3/uL (ref 0.1–1.0)
Monocytes Relative: 7 %
Neutro Abs: 5.4 10*3/uL (ref 1.7–7.7)
Neutrophils Relative %: 61 %
Platelets: 380 10*3/uL (ref 150–400)
RBC: 4.6 MIL/uL (ref 3.87–5.11)
RDW: 13.1 % (ref 11.5–15.5)
WBC: 8.6 10*3/uL (ref 4.0–10.5)
nRBC: 0 % (ref 0.0–0.2)

## 2018-09-14 LAB — SARS CORONAVIRUS 2 BY RT PCR (HOSPITAL ORDER, PERFORMED IN ~~LOC~~ HOSPITAL LAB): SARS Coronavirus 2: NEGATIVE

## 2018-09-14 LAB — LIPASE, BLOOD: Lipase: 31 U/L (ref 11–51)

## 2018-09-14 MED ORDER — HYDROMORPHONE HCL 1 MG/ML IJ SOLN
1.0000 mg | Freq: Once | INTRAMUSCULAR | Status: AC
  Administered 2018-09-14: 21:00:00 1 mg via INTRAVENOUS
  Filled 2018-09-14: qty 1

## 2018-09-14 MED ORDER — SUMATRIPTAN SUCCINATE 50 MG PO TABS: 25.0000 mg | ORAL_TABLET | ORAL | Status: DC | PRN

## 2018-09-14 MED ORDER — HYDRALAZINE HCL 20 MG/ML IJ SOLN: 5.0000 mg | Freq: Four times a day (QID) | INTRAMUSCULAR | Status: DC | PRN

## 2018-09-14 MED ORDER — SODIUM CHLORIDE 0.9 % IV SOLN
INTRAVENOUS | Status: DC
  Administered 2018-09-14 (×2): via INTRAVENOUS

## 2018-09-14 MED ORDER — SODIUM CHLORIDE 0.9 % IV SOLN
1.0000 g | Freq: Once | INTRAVENOUS | Status: AC
  Administered 2018-09-14: 21:00:00 1 g via INTRAVENOUS
  Filled 2018-09-14: qty 10

## 2018-09-14 MED ORDER — SENNOSIDES-DOCUSATE SODIUM 8.6-50 MG PO TABS: 1.0000 | ORAL_TABLET | Freq: Every evening | ORAL | Status: DC | PRN

## 2018-09-14 MED ORDER — HYDROCODONE-ACETAMINOPHEN 7.5-325 MG/15ML PO SOLN: 15.0000 mL | Freq: Four times a day (QID) | ORAL | Status: DC | PRN

## 2018-09-14 MED ORDER — ONDANSETRON HCL 4 MG/2ML IJ SOLN
4.0000 mg | Freq: Four times a day (QID) | INTRAMUSCULAR | Status: DC | PRN
  Administered 2018-09-15: 10:00:00 4 mg via INTRAVENOUS
  Filled 2018-09-14: qty 2

## 2018-09-14 MED ORDER — SODIUM CHLORIDE 0.9 % IV SOLN
2.0000 g | INTRAVENOUS | Status: DC
  Administered 2018-09-15: 2 g via INTRAVENOUS

## 2018-09-14 MED ORDER — METRONIDAZOLE IN NACL 5-0.79 MG/ML-% IV SOLN
500.0000 mg | Freq: Once | INTRAVENOUS | Status: AC
  Administered 2018-09-14: 500 mg via INTRAVENOUS
  Filled 2018-09-14: qty 100

## 2018-09-14 MED ORDER — ONDANSETRON HCL 4 MG PO TABS
4.0000 mg | ORAL_TABLET | Freq: Four times a day (QID) | ORAL | Status: DC | PRN
  Administered 2018-09-15: 19:00:00 4 mg via ORAL
  Filled 2018-09-14: qty 1

## 2018-09-14 NOTE — ED Notes (Signed)
Report to Rachel RN

## 2018-09-14 NOTE — ED Triage Notes (Signed)
Pt told to return to ED by PCP.She was in earlier for [place] and was told to return due to acute cholecystitis

## 2018-09-14 NOTE — ED Provider Notes (Signed)
[facility] EMERGENCY DEPARTMENT Provider Note   CSN: [id number] Arrival date & time: 09/14/18  1626     History   Chief Complaint Chief Complaint  Patient presents with   Abnormal Lab    HPI Sheryl [name] is a 38 y.o. female.     HPI   38yF with cholecystitis. Diagnosed on outpt US and advised to come to ED. Reports intermittent upper abdominal pain for the past ~5 weeks. Sometimes exacerbated by eating. Nausea. Occasional vomiting. No fever or chills. No urinary complaints.   Past Medical History:  Diagnosis Date   History of HPV infection    Hypertension    Migraines    Vaginal Pap smear, abnormal     Patient Active Problem List   Diagnosis Date Noted   S/P tonsillectomy 09/21/2016   Anemia 11/05/2014    Past Surgical History:  Procedure Laterality Date   DILITATION & CURRETTAGE/HYSTROSCOPY WITH NOVASURE ABLATION N/A 11/07/2014   Procedure: DILATATION & CURETTAGE/HYSTEROSCOPY WITH NOVASURE ABLATION  Uterine Cavity Length 6.0cm Uterine Cavity Width 4.6cm Power 152 watts 49 sec is Time;  Surgeon: Lazaro ArmsLuther H Eure, MD;  Location: [facility];  Service: Gynecology;  Laterality: N/A;   LEEP     TONSILLECTOMY N/A 09/21/2016   Procedure: TONSILLECTOMY;  Surgeon: Serena Colonelosen, Jefry, MD;  Location:  SURGERY CENTER;  Service: ENT;  Laterality: N/A;     OB History    Gravida  1   Para  1   Term  1   Preterm      AB      Living  1     SAB      TAB      Ectopic      Multiple      Live Births  1            Home Medications    Prior to Admission medications   Medication Sig Start Date End Date Taking? Authorizing Provider  bisoprolol-hydrochlorothiazide (ZIAC) 2.5-6.25 MG tablet Take 3 tablets by mouth daily.  11/01/14   [provider]  HYDROcodone-acetaminophen (HYCET) 7.5-325 mg/15 ml solution Take 15 mLs by mouth 4 (four) times daily as needed for moderate pain. 09/21/16   Serena Colonelosen, Jefry, MD  promethazine (PHENERGAN) 25 MG  suppository Place 1 suppository (25 mg total) rectally every 6 (six) hours as needed for nausea or vomiting. 09/21/16   Serena Colonelosen, Jefry, MD  SUMAtriptan (IMITREX) 25 MG tablet Take 25 mg by mouth every 2 (two) hours as needed for migraine. May repeat in 2 hours if headache persists or recurs.    [provider]    Family History Family History  Problem Relation Age of Onset   Heart attack Paternal Grandfather    Heart attack Paternal Grandmother    Cancer Maternal Grandmother        pancreatic   Other Maternal Grandfather        black lung   Hypertension Father    Diabetes Father    Heart disease Father    Other Father        bypass surgeries in legs-veins   Cancer Mother     Social History Social History   Tobacco Use   Smoking status: Never Smoker   Smokeless tobacco: Never Used  Substance Use Topics   Alcohol use: No   Drug use: No     Allergies   Patient has no known allergies.   Review of Systems Review of Systems All systems reviewed and negative, other  than as noted in HPI.   Physical Exam Updated Vital Signs BP 140/85 (BP Location: Right Arm)    Pulse 65    Temp 98 F (36.7 C) (Oral)    Resp 16    Ht 5\' 8"  (1.727 m)    Wt 120.7 kg    LMP 09/05/2018    SpO2 98%    BMI 40.45 kg/m   Physical Exam Vitals signs and nursing note reviewed.  Constitutional:      General: She is not in acute distress.    Appearance: She is well-developed.  HENT:     Head: Normocephalic and atraumatic.  Eyes:     General:        Right eye: No discharge.        Left eye: No discharge.     Conjunctiva/sclera: Conjunctivae normal.  Neck:     Musculoskeletal: Neck supple.  Cardiovascular:     Rate and Rhythm: Normal rate and regular rhythm.     Heart sounds: Normal heart sounds. No murmur. No friction rub. No gallop.   Pulmonary:     Effort: Pulmonary effort is normal. No respiratory distress.     Breath sounds: Normal breath sounds.  Abdominal:      General: There is no distension.     Palpations: Abdomen is soft. There is no mass.     Tenderness: There is abdominal tenderness. There is no guarding.     Comments: TTP RUQ and epigastrium w/o rebound or guarding.   Musculoskeletal:        General: No tenderness.  Skin:    General: Skin is warm and dry.  Neurological:     Mental Status: She is alert.  Psychiatric:        Behavior: Behavior normal.        Thought Content: Thought content normal.      ED Treatments / Results  Labs (all labs ordered are listed, but only abnormal results are displayed) Labs Reviewed  COMPREHENSIVE METABOLIC PANEL - Abnormal; Notable for the following components:      Result Value   Glucose, Bld 104 (*)    Calcium 8.7 (*)    All other components within normal limits  COMPREHENSIVE METABOLIC PANEL - Abnormal; Notable for the following components:   Calcium 8.5 (*)    Albumin 3.2 (*)    All other components within normal limits  SARS CORONAVIRUS 2 (HOSPITAL ORDER, PERFORMED IN [facility])  PREGNANCY, URINE  CBC WITH DIFFERENTIAL/PLATELET  LIPASE, BLOOD  HIV ANTIBODY (ROUTINE TESTING W REFLEX)  CBC  SURGICAL PATHOLOGY    EKG None  Radiology [place] Abdomen Limited Ruq  Result Date: 09/14/2018 CLINICAL DATA:  Right upper quadrant pain EXAM: ULTRASOUND ABDOMEN LIMITED RIGHT UPPER QUADRANT COMPARISON:  None. FINDINGS: Gallbladder: Within the gallbladder, there is a focal calculus imbedded in the neck of the gallbladder measuring 2.3 cm. There is also sludge in the gallbladder. Gallbladder wall is mildly thickened. There is no pericholecystic fluid. Patient is focally tender over the gallbladder. Common bile duct: Diameter: 3 mm. No intrahepatic or extrahepatic biliary duct dilatation. Liver: No focal lesion identified. Liver echogenicity is increased diffusely. Portal vein is patent on color Doppler imaging with normal direction of blood flow towards the liver. Other: None. IMPRESSION: 1.  2.3 cm in size gallstone imbedded in the neck of the gallbladder. Gallbladder wall mildly thickened. Patient is focally tender over the gallbladder. These are findings concerning for acute cholecystitis. 2. Diffuse increase in liver  echogenicity, a finding indicative of hepatic steatosis. While no focal liver lesions are evident on this study, it must be cautioned that the sensitivity of ultrasound for detection of focal liver lesions is diminished in this circumstance. These results will be called to the ordering clinician or representative by the Radiologist Assistant, and communication documented in the PACS or zVision Dashboard. Electronically Signed   By: Lowella Grip III M.D.   On: 09/14/2018 15:02    Procedures Procedures (including critical care time)  Medications Ordered in ED Medications  cefTRIAXone (ROCEPHIN) 1 g in sodium chloride 0.9 % 100 mL IVPB (has no administration in time range)  metroNIDAZOLE (FLAGYL) IVPB 500 mg (has no administration in time range)  0.9 %  sodium chloride infusion (has no administration in time range)  HYDROmorphone (DILAUDID) injection 1 mg (has no administration in time range)     Initial Impression / Assessment and Plan / ED Course  I have reviewed the triage vital signs and the nursing notes.  Pertinent labs & imaging results that were available during my care of the patient were reviewed by me and considered in my medical decision making (see chart for details).        38yf with symptoms consistent with biliary colic and [place] with stone and findings of cholecystitis. Afebrile. Nontoxic. Abx ordered. Discussed with Dr Constance Haw. Surgery will see in am. Keep NPO after midnight. Hospital service consulted for admit.   Final Clinical Impressions(s) / ED Diagnoses   Final diagnoses:  Calculus of gallbladder with acute cholecystitis without obstruction    ED Discharge Orders    None       Virgel Manifold, MD 09/23/18 1435

## 2018-09-14 NOTE — ED Notes (Signed)
Report to Apolonio Schneiders, RN  To floor via [facility]

## 2018-09-14 NOTE — ED Notes (Signed)
Dr Kim in to assess    

## 2018-09-14 NOTE — ED Notes (Signed)
Reports here earlier and sent back for further eval

## 2018-09-14 NOTE — H&P (Signed)
History and Physical    Sheryl RobertsShannon [name] WGN:[id number] DOB: 07/03/1980 DOA: 09/14/2018  I have briefly reviewed the patient's [facility]  PCP: Mitzi Hansenaniel, Janice, NP  Patient coming from: home  Chief Complaint: Right upper quadrant abdominal pain  HPI: Sheryl RobertsShannon [name] is a 38 y.o. female with medical history significant of hypertension, obesity, migraine headaches, comes to the hospital with 5 weeks of intermittent right upper quadrant abdominal pain.  She is also complaining of intermittent nausea and vomiting.  She has noticed that there is a relationship between eating and getting pain along with nausea.  She denies any fever or chills, she denies any blood in her emesis, she denies any chest pain or shortness of breath.  She denies any lightheadedness or dizziness.  Other than intermittent right upper quadrant abdominal pain she is at her baseline.  She has no known cardiac history, no exertional chest pain or dyspnea.  She has not been in contact with any sick people, denies any sore throat, no cough or chest congestion.  ED Course: In the ED she is afebrile, normotensive, satting well on room air.  Blood work is essentially unremarkable with normal kidney function, normal CBC.  Covid is pending. Right upper quad ultrasound shows a 2.3 cm size gallstone embedded in the neck of the gallbladder with mild thickening of the gallbladder wall.  EDP discussed with general surgery with tentative plans for surgery first thing in the morning, we are asked to admit.  Review of Systems: As per HPI otherwise 10 point review of systems negative.   Past Medical History:  Diagnosis Date   History of HPV infection    Hypertension    Migraines    Vaginal Pap smear, abnormal     Past Surgical History:  Procedure Laterality Date   DILITATION & CURRETTAGE/HYSTROSCOPY WITH NOVASURE ABLATION N/A 11/07/2014   Procedure: DILATATION & CURETTAGE/HYSTEROSCOPY WITH NOVASURE ABLATION   Uterine Cavity Length 6.0cm Uterine Cavity Width 4.6cm Power 152 watts 49 sec is Time;  Surgeon: Lazaro ArmsLuther H Eure, MD;  Location: [facility];  Service: Gynecology;  Laterality: N/A;   LEEP     TONSILLECTOMY N/A 09/21/2016   Procedure: TONSILLECTOMY;  Surgeon: Serena Colonelosen, Jefry, MD;  Location: [facility];  Service: ENT;  Laterality: N/A;     reports that she has never smoked. She has never used smokeless tobacco. She reports that she does not drink alcohol or use drugs.  No Known Allergies  Family History  Problem Relation Age of Onset   Heart attack Paternal Grandfather    Heart attack Paternal Grandmother    Cancer Maternal Grandmother        pancreatic   Other Maternal Grandfather        black lung   Hypertension Father    Diabetes Father    Heart disease Father    Other Father        bypass surgeries in legs-veins   Cancer Mother     Prior to Admission medications   Medication Sig Start Date End Date Taking? Authorizing Provider  bisoprolol-hydrochlorothiazide (ZIAC) 2.5-6.25 MG tablet Take 3 tablets by mouth daily.  11/01/14   [provider]  HYDROcodone-acetaminophen (HYCET) 7.5-325 mg/15 ml solution Take 15 mLs by mouth 4 (four) times daily as needed for moderate pain. 09/21/16   Serena Colonelosen, Jefry, MD  promethazine (PHENERGAN) 25 MG suppository Place 1 suppository (25 mg total) rectally every 6 (six) hours as needed for nausea or vomiting. 09/21/16  Serena Colonelosen, Jefry, MD  SUMAtriptan (IMITREX) 25 MG tablet Take 25 mg by mouth every 2 (two) hours as needed for migraine. May repeat in 2 hours if headache persists or recurs.    [provider]    Physical Exam: Vitals:   09/14/18 1649 09/14/18 2103 09/14/18 2116 09/14/18 2130  BP: 140/85 (!) 146/79  140/81  Pulse: 65  76 63  Resp: 16     Temp: 98 F (36.7 C)     TempSrc: Oral     SpO2: 98%  98% 98%  Weight: 120.7 kg     Height: 5\' 8"  (1.727 m)       Constitutional: NAD, calm, comfortable Eyes:  PERRL, lids and conjunctivae normal ENMT: Mucous membranes are moist. Posterior pharynx clear of any exudate or lesions.Normal dentition.  Neck: normal, supple, Respiratory: clear to auscultation bilaterally, no wheezing, no crackles. Normal respiratory effort. No accessory muscle use.  Cardiovascular: Regular rate and rhythm, no murmurs / rubs / gallops. No extremity edema. 2+ pedal pulses.  Abdomen: Right upper quadrant tenderness to palpation, no guarding or rebound.  Murphy sign positive. Musculoskeletal: no clubbing / cyanosis. Normal muscle tone.  Skin: no rashes, lesions, ulcers. No induration Neurologic: CN 2-12 grossly intact. Strength 5/5 in all 4.  Psychiatric: Normal judgment and insight. Alert and oriented x 3. Normal mood.   Labs on Admission: I have personally reviewed following labs and imaging studies  CBC: Recent Labs  Lab 09/14/18 2040  WBC 8.6  NEUTROABS 5.4  HGB 12.8  HCT 40.4  MCV 87.8  PLT 380   Basic Metabolic Panel: Recent Labs  Lab 09/14/18 2040  NA 138  K 3.6  CL 102  CO2 25  GLUCOSE 104*  BUN 9  CREATININE 0.79  CALCIUM 8.7*   GFR: Estimated Creatinine Clearance: 129.1 mL/min (by C-G formula based on SCr of 0.79 mg/dL). Liver Function Tests: Recent Labs  Lab 09/14/18 2040  AST 15  ALT 16  ALKPHOS 69  BILITOT 0.3  PROT 7.4  ALBUMIN 3.6   Recent Labs  Lab 09/14/18 2040  LIPASE 31   No results for input(s): AMMONIA in the last 168 hours. Coagulation Profile: No results for input(s): INR, PROTIME in the last 168 hours. Cardiac Enzymes: No results for input(s): CKTOTAL, CKMB, CKMBINDEX, TROPONINI in the last 168 hours. BNP (last 3 results) No results for input(s): PROBNP in the last 8760 hours. HbA1C: No results for input(s): HGBA1C in the last 72 hours. CBG: No results for input(s): GLUCAP in the last 168 hours. Lipid Profile: No results for input(s): CHOL, HDL, LDLCALC, TRIG, CHOLHDL, LDLDIRECT in the last 72 hours. Thyroid  Function Tests: No results for input(s): TSH, T4TOTAL, FREET4, T3FREE, THYROIDAB in the last 72 hours. Anemia Panel: No results for input(s): VITAMINB12, FOLATE, FERRITIN, TIBC, IRON, RETICCTPCT in the last 72 hours. Urine analysis: No results found for: COLORURINE, APPEARANCEUR, LABSPEC, PHURINE, GLUCOSEU, HGBUR, BILIRUBINUR, KETONESUR, PROTEINUR, UROBILINOGEN, NITRITE, LEUKOCYTESUR   Radiological Exams on Admission: Kore[place] Abdomen Limited Ruq  Result Date: 09/14/2018 CLINICAL DATA:  Right upper quadrant pain EXAM: ULTRASOUND ABDOMEN LIMITED RIGHT UPPER QUADRANT COMPARISON:  None. FINDINGS: Gallbladder: Within the gallbladder, there is a focal calculus imbedded in the neck of the gallbladder measuring 2.3 cm. There is also sludge in the gallbladder. Gallbladder wall is mildly thickened. There is no pericholecystic fluid. Patient is focally tender over the gallbladder. Common bile duct: Diameter: 3 mm. No intrahepatic or extrahepatic biliary duct dilatation. Liver: No focal lesion identified.  Liver echogenicity is increased diffusely. Portal vein is patent on color Doppler imaging with normal direction of blood flow towards the liver. Other: None. IMPRESSION: 1. 2.3 cm in size gallstone imbedded in the neck of the gallbladder. Gallbladder wall mildly thickened. Patient is focally tender over the gallbladder. These are findings concerning for acute cholecystitis. 2. Diffuse increase in liver echogenicity, a finding indicative of hepatic steatosis. While no focal liver lesions are evident on this study, it must be cautioned that the sensitivity of ultrasound for detection of focal liver lesions is diminished in this circumstance. These results will be called to the ordering clinician or representative by the Radiologist Assistant, and communication documented in the PACS or zVision Dashboard. Electronically Signed   By: Lowella Grip III M.D.   On: 09/14/2018 15:02    EKG: Independently reviewed. Pending    Assessment/Plan Active Problems:   Acute cholecystitis   Principal Problem Acute cholecystitis -General surgery consulted, EDP discussed with Dr. Constance Haw and apparently there are tentative plans to operate tomorrow.  Will make n.p.o. after midnight.  She was given ceftriaxone, continue -Continue home pain medications  Active Problems Hypertension -Hold home antihypertensives peri-op  Migraine headaches -Sumatriptan as needed   DVT prophylaxis: SCDs Code Status: Full code Family Communication: No family at bedside Disposition Plan: Admit to [facility] called: General surgery   Marzetta Board, MD, PhD [facility]  Contact via [email or web address]  [facility] Office Info P: [phone] F: [phone]   09/14/2018, 9:54 PM

## 2018-09-15 ENCOUNTER — Encounter (HOSPITAL_COMMUNITY)
Admission: EM | Disposition: A | Discharge status: Home / Self Care | Payer: Self-pay | Source: Home / Self Care | Attending: Internal Medicine

## 2018-09-15 ENCOUNTER — Inpatient Hospital Stay (HOSPITAL_COMMUNITY): Payer: Federal, State, Local not specified - PPO | Admitting: Anesthesiology

## 2018-09-15 ENCOUNTER — Other Ambulatory Visit: Payer: Self-pay

## 2018-09-15 ENCOUNTER — Encounter (HOSPITAL_COMMUNITY): Payer: Self-pay

## 2018-09-15 DIAGNOSIS — K821 Hydrops of gallbladder: Secondary | ICD-10-CM

## 2018-09-15 DIAGNOSIS — K8012 Calculus of gallbladder with acute and chronic cholecystitis without obstruction: Secondary | ICD-10-CM | POA: Diagnosis not present

## 2018-09-15 DIAGNOSIS — K810 Acute cholecystitis: Secondary | ICD-10-CM | POA: Diagnosis not present

## 2018-09-15 HISTORY — PX: CHOLECYSTECTOMY: SHX55

## 2018-09-15 LAB — CBC
HCT: 38.4 % (ref 36.0–46.0)
Hemoglobin: 12.1 g/dL (ref 12.0–15.0)
MCH: 28.1 pg (ref 26.0–34.0)
MCHC: 31.5 g/dL (ref 30.0–36.0)
MCV: 89.1 fL (ref 80.0–100.0)
Platelets: 352 10*3/uL (ref 150–400)
RBC: 4.31 MIL/uL (ref 3.87–5.11)
RDW: 12.9 % (ref 11.5–15.5)
WBC: 7.4 10*3/uL (ref 4.0–10.5)
nRBC: 0 % (ref 0.0–0.2)

## 2018-09-15 LAB — COMPREHENSIVE METABOLIC PANEL
ALT: 16 U/L (ref 0–44)
AST: 16 U/L (ref 15–41)
Albumin: 3.2 g/dL — ABNORMAL LOW (ref 3.5–5.0)
Alkaline Phosphatase: 66 U/L (ref 38–126)
Anion gap: 6 (ref 5–15)
BUN: 8 mg/dL (ref 6–20)
CO2: 27 mmol/L (ref 22–32)
Calcium: 8.5 mg/dL — ABNORMAL LOW (ref 8.9–10.3)
Chloride: 106 mmol/L (ref 98–111)
Creatinine, Ser: 0.73 mg/dL (ref 0.44–1.00)
GFR calc Af Amer: >60 mL/min (ref >60)
GFR calc non Af Amer: >60 mL/min (ref >60)
Glucose, Bld: 98 mg/dL (ref 70–99)
Potassium: 3.8 mmol/L (ref 3.5–5.1)
Sodium: 139 mmol/L (ref 135–145)
Total Bilirubin: 0.4 mg/dL (ref 0.3–1.2)
Total Protein: 6.5 g/dL (ref 6.5–8.1)

## 2018-09-15 SURGERY — LAPAROSCOPIC CHOLECYSTECTOMY
Anesthesia: General

## 2018-09-15 MED ORDER — SODIUM CHLORIDE 0.9 % IV SOLN: 2.0000 g | INTRAVENOUS | Status: DC

## 2018-09-15 MED ORDER — SUCCINYLCHOLINE CHLORIDE 200 MG/10ML IV SOSY
PREFILLED_SYRINGE | INTRAVENOUS | Status: DC | PRN
  Administered 2018-09-15: 160 mg via INTRAVENOUS

## 2018-09-15 MED ORDER — BUPIVACAINE HCL (PF) 0.5 % IJ SOLN
INTRAMUSCULAR | Status: DC | PRN
  Administered 2018-09-15: 20 mL

## 2018-09-15 MED ORDER — HYDROMORPHONE HCL 1 MG/ML IJ SOLN
0.5000 mg | INTRAMUSCULAR | Status: DC | PRN
  Administered 2018-09-15: 0.5 mg via INTRAVENOUS
  Filled 2018-09-15: qty 0.5

## 2018-09-15 MED ORDER — OXYCODONE HCL 5 MG PO TABS: 5.0000 mg | ORAL_TABLET | ORAL | 0 refills | Status: AC | PRN

## 2018-09-15 MED ORDER — CHLORHEXIDINE GLUCONATE CLOTH 2 % EX PADS: 6.0000 | MEDICATED_PAD | Freq: Once | CUTANEOUS | Status: DC

## 2018-09-15 MED ORDER — MIDAZOLAM HCL 5 MG/5ML IJ SOLN
INTRAMUSCULAR | Status: DC | PRN
  Administered 2018-09-15: 2 mg via INTRAVENOUS

## 2018-09-15 MED ORDER — HYDROMORPHONE HCL 1 MG/ML IJ SOLN
0.2500 mg | INTRAMUSCULAR | Status: DC | PRN
  Administered 2018-09-15 (×2): 0.5 mg via INTRAVENOUS
  Filled 2018-09-15 (×2): qty 0.5

## 2018-09-15 MED ORDER — OXYCODONE HCL 5 MG PO TABS
5.0000 mg | ORAL_TABLET | ORAL | Status: DC | PRN
  Administered 2018-09-15 (×2): 5 mg via ORAL
  Filled 2018-09-15 (×2): qty 1

## 2018-09-15 MED ORDER — PROPOFOL 10 MG/ML IV BOLUS
INTRAVENOUS | Status: DC | PRN
  Administered 2018-09-15: 200 mg via INTRAVENOUS

## 2018-09-15 MED ORDER — DOCUSATE SODIUM 100 MG PO CAPS
100.0000 mg | ORAL_CAPSULE | Freq: Two times a day (BID) | ORAL | Status: DC
  Administered 2018-09-15: 14:00:00 100 mg via ORAL
  Filled 2018-09-15: qty 1

## 2018-09-15 MED ORDER — BUPIVACAINE HCL (PF) 0.5 % IJ SOLN
INTRAMUSCULAR | Status: AC
  Filled 2018-09-15: qty 30

## 2018-09-15 MED ORDER — LIDOCAINE 2% (20 MG/ML) 5 ML SYRINGE
INTRAMUSCULAR | Status: AC
  Filled 2018-09-15: qty 5

## 2018-09-15 MED ORDER — SCOPOLAMINE 1 MG/3DAYS TD PT72
MEDICATED_PATCH | TRANSDERMAL | Status: DC | PRN
  Administered 2018-09-15: 1 via TRANSDERMAL

## 2018-09-15 MED ORDER — MIDAZOLAM HCL 2 MG/2ML IJ SOLN: 0.5000 mg | Freq: Once | INTRAMUSCULAR | Status: DC | PRN

## 2018-09-15 MED ORDER — SCOPOLAMINE 1 MG/3DAYS TD PT72: 1.0000 | MEDICATED_PATCH | TRANSDERMAL | Status: DC

## 2018-09-15 MED ORDER — MIDAZOLAM HCL 2 MG/2ML IJ SOLN
INTRAMUSCULAR | Status: AC
  Filled 2018-09-15: qty 2

## 2018-09-15 MED ORDER — ONDANSETRON HCL 4 MG PO TABS: 4.0000 mg | ORAL_TABLET | Freq: Four times a day (QID) | ORAL | 0 refills | Status: AC | PRN

## 2018-09-15 MED ORDER — LACTATED RINGERS IV SOLN
INTRAVENOUS | Status: DC
  Administered 2018-09-15: 10:00:00 via INTRAVENOUS

## 2018-09-15 MED ORDER — DEXAMETHASONE SODIUM PHOSPHATE 10 MG/ML IJ SOLN
INTRAMUSCULAR | Status: DC | PRN
  Administered 2018-09-15: 10 mg via INTRAVENOUS

## 2018-09-15 MED ORDER — SODIUM CHLORIDE 0.9 % IV SOLN
INTRAVENOUS | Status: AC
  Filled 2018-09-15: qty 2

## 2018-09-15 MED ORDER — FENTANYL CITRATE (PF) 100 MCG/2ML IJ SOLN
INTRAMUSCULAR | Status: AC
  Filled 2018-09-15: qty 2

## 2018-09-15 MED ORDER — SUGAMMADEX SODIUM 500 MG/5ML IV SOLN
INTRAVENOUS | Status: DC | PRN
  Administered 2018-09-15: 245 mg via INTRAVENOUS

## 2018-09-15 MED ORDER — DEXAMETHASONE SODIUM PHOSPHATE 10 MG/ML IJ SOLN
INTRAMUSCULAR | Status: AC
  Filled 2018-09-15: qty 1

## 2018-09-15 MED ORDER — SODIUM CHLORIDE 0.9 % IR SOLN
Status: DC | PRN
  Administered 2018-09-15: 1000 mL

## 2018-09-15 MED ORDER — ONDANSETRON HCL 4 MG/2ML IJ SOLN
INTRAMUSCULAR | Status: AC
  Filled 2018-09-15: qty 2

## 2018-09-15 MED ORDER — FENTANYL CITRATE (PF) 250 MCG/5ML IJ SOLN
INTRAMUSCULAR | Status: AC
  Filled 2018-09-15: qty 5

## 2018-09-15 MED ORDER — SCOPOLAMINE 1 MG/3DAYS TD PT72
MEDICATED_PATCH | TRANSDERMAL | Status: AC
  Filled 2018-09-15: qty 1

## 2018-09-15 MED ORDER — HEMOSTATIC AGENTS (NO CHARGE) OPTIME
TOPICAL | Status: DC | PRN
  Administered 2018-09-15 (×2): 1 via TOPICAL

## 2018-09-15 MED ORDER — DOCUSATE SODIUM 100 MG PO CAPS: 100.0000 mg | ORAL_CAPSULE | Freq: Two times a day (BID) | ORAL | 1 refills | Status: AC

## 2018-09-15 MED ORDER — FENTANYL CITRATE (PF) 100 MCG/2ML IJ SOLN
INTRAMUSCULAR | Status: DC | PRN
  Administered 2018-09-15 (×3): 100 ug via INTRAVENOUS
  Administered 2018-09-15: 50 ug via INTRAVENOUS

## 2018-09-15 MED ORDER — ROCURONIUM BROMIDE 100 MG/10ML IV SOLN
INTRAVENOUS | Status: DC | PRN
  Administered 2018-09-15: 50 mg via INTRAVENOUS
  Administered 2018-09-15: 20 mg via INTRAVENOUS

## 2018-09-15 MED ORDER — ROCURONIUM BROMIDE 10 MG/ML (PF) SYRINGE
PREFILLED_SYRINGE | INTRAVENOUS | Status: AC
  Filled 2018-09-15: qty 10

## 2018-09-15 MED ORDER — PROMETHAZINE HCL 25 MG/ML IJ SOLN: 6.2500 mg | INTRAMUSCULAR | Status: DC | PRN

## 2018-09-15 MED ORDER — LIDOCAINE HCL (CARDIAC) PF 100 MG/5ML IV SOSY
PREFILLED_SYRINGE | INTRAVENOUS | Status: DC | PRN
  Administered 2018-09-15: 100 mg via INTRAVENOUS

## 2018-09-15 MED ORDER — SUCCINYLCHOLINE CHLORIDE 200 MG/10ML IV SOSY
PREFILLED_SYRINGE | INTRAVENOUS | Status: AC
  Filled 2018-09-15: qty 10

## 2018-09-15 SURGICAL SUPPLY — 48 items
APPLICATOR ARISTA FLEXITIP XL (MISCELLANEOUS) ×2 IMPLANT
APPLIER CLIP ROT 10 11.4 M/L (STAPLE) ×2
BAG RETRIEVAL 10 (BASKET) ×2
BLADE SURG 15 STRL LF DISP TIS (BLADE) ×1 IMPLANT
BLADE SURG 15 STRL SS (BLADE) ×1
CHLORAPREP W/TINT 26 (MISCELLANEOUS) ×2 IMPLANT
CLIP APPLIE ROT 10 11.4 M/L (STAPLE) ×1 IMPLANT
CLOTH BEACON ORANGE TIMEOUT ST (SAFETY) ×2 IMPLANT
COVER LIGHT HANDLE STERIS (MISCELLANEOUS) ×4 IMPLANT
COVER WAND RF STERILE (DRAPES) ×2 IMPLANT
CUTTER FLEX LINEAR 45M (STAPLE) ×2 IMPLANT
DECANTER SPIKE VIAL GLASS SM (MISCELLANEOUS) ×2 IMPLANT
DERMABOND ADVANCED (GAUZE/BANDAGES/DRESSINGS) ×1
DERMABOND ADVANCED .7 DNX12 (GAUZE/BANDAGES/DRESSINGS) ×1 IMPLANT
ELECT REM PT RETURN 9FT ADLT (ELECTROSURGICAL) ×2
ELECTRODE REM PT RTRN 9FT ADLT (ELECTROSURGICAL) ×1 IMPLANT
FILTER SMOKE EVAC LAPAROSHD (FILTER) ×2 IMPLANT
GLOVE BIO SURGEON STRL SZ 6.5 (GLOVE) ×2 IMPLANT
GLOVE BIO SURGEON STRL SZ7 (GLOVE) ×6 IMPLANT
GLOVE BIOGEL PI IND STRL 6.5 (GLOVE) ×2 IMPLANT
GLOVE BIOGEL PI IND STRL 7.0 (GLOVE) ×3 IMPLANT
GLOVE BIOGEL PI INDICATOR 6.5 (GLOVE) ×2
GLOVE BIOGEL PI INDICATOR 7.0 (GLOVE) ×3
GLOVE ECLIPSE 6.5 STRL STRAW (GLOVE) ×2 IMPLANT
GOWN STRL REUS W/ TWL LRG LVL3 (GOWN DISPOSABLE) ×1 IMPLANT
GOWN STRL REUS W/TWL LRG LVL3 (GOWN DISPOSABLE) ×7 IMPLANT
HEMOSTAT ARISTA ABSORB 3G PWDR (HEMOSTASIS) ×2 IMPLANT
HEMOSTAT SNOW SURGICEL 2X4 (HEMOSTASIS) ×2 IMPLANT
INST SET LAPROSCOPIC AP (KITS) ×2 IMPLANT
KIT TURNOVER KIT A (KITS) ×2 IMPLANT
MANIFOLD NEPTUNE II (INSTRUMENTS) ×2 IMPLANT
NEEDLE INSUFFLATION 14GA 120MM (NEEDLE) ×2 IMPLANT
NS IRRIG 1000ML POUR BTL (IV SOLUTION) ×2 IMPLANT
PACK LAP CHOLE LZT030E (CUSTOM PROCEDURE TRAY) ×2 IMPLANT
PAD ARMBOARD 7.5X6 YLW CONV (MISCELLANEOUS) ×2 IMPLANT
RELOAD 45 VASCULAR/THIN (ENDOMECHANICALS) ×2 IMPLANT
SET BASIN LINEN APH (SET/KITS/TRAYS/PACK) ×2 IMPLANT
SLEEVE ENDOPATH XCEL 5M (ENDOMECHANICALS) ×2 IMPLANT
SUT MNCRL AB 4-0 PS2 18 (SUTURE) ×2 IMPLANT
SUT VICRYL 0 UR6 27IN ABS (SUTURE) ×2 IMPLANT
SYS BAG RETRIEVAL 10MM (BASKET) ×2
SYSTEM BAG RETRIEVAL 10MM (BASKET) ×2 IMPLANT
TROCAR ENDO BLADELESS 11MM (ENDOMECHANICALS) ×2 IMPLANT
TROCAR XCEL NON-BLD 5MMX100MML (ENDOMECHANICALS) ×2 IMPLANT
TROCAR XCEL UNIV SLVE 11M 100M (ENDOMECHANICALS) ×2 IMPLANT
TUBE CONNECTING 12X1/4 (SUCTIONS) ×2 IMPLANT
TUBING INSUFFLATION (TUBING) ×2 IMPLANT
WARMER LAPAROSCOPE (MISCELLANEOUS) ×2 IMPLANT

## 2018-09-15 NOTE — Progress Notes (Signed)
PROGRESS NOTE    Sheryl [name]  YKZ:[id number] DOB: May 26, 1980 DOA: 09/14/2018 PCP: Carolee Rota, NP   Brief Narrative:  Per HPI: Sheryl [name] is a 38 y.o. female with medical history significant of hypertension, obesity, migraine headaches, comes to the hospital with 5 weeks of intermittent right upper quadrant abdominal pain.  She is also complaining of intermittent nausea and vomiting.  She has noticed that there is a relationship between eating and getting pain along with nausea.  She denies any fever or chills, she denies any blood in her emesis, she denies any chest pain or shortness of breath.  She denies any lightheadedness or dizziness.  Other than intermittent right upper quadrant abdominal pain she is at her baseline.  She has no known cardiac history, no exertional chest pain or dyspnea.  She has not been in contact with any sick people, denies any sore throat, no cough or chest congestion.  Patient was admitted for acute cholecystitis with plans for laparoscopic versus open cholecystectomy today.  Assessment & Plan:   Active Problems:   Acute cholecystitis   Acute cholecystitis -Cholecystectomy per general surgery today -Continue on Rocephin -IV Dilaudid as needed for pain control -Diet advancement per general surgery after procedure  Hypertension-stable -Resume blood pressure medications once stable perioperatively  Migraine headaches -Sumatriptan as needed   DVT prophylaxis: SCDs Code Status: Full Family Communication: None at bedside Disposition Plan: Cholecystectomy per general surgery today   Consultants:   General surgery  Procedures:   None  Antimicrobials:  Anti-infectives (From admission, onward)   Start     Dose/Rate Route Frequency Ordered Stop   09/15/18 1200  [MAR Hold]  cefTRIAXone (ROCEPHIN) 2 g in sodium chloride 0.9 % 100 mL IVPB     (MAR Hold since Thu 09/15/2018 at 0951.Hold Reason: Transfer to a Procedural area.)   2 g 200 mL/hr  over 30 Minutes Intravenous Every 24 hours 09/14/18 2154     09/15/18 1015  cefoTEtan (CEFOTAN) 2 g in sodium chloride 0.9 % 100 mL IVPB     2 g 200 mL/hr over 30 Minutes Intravenous On call to O.R. 09/15/18 1002 09/16/18 0559   09/14/18 2030  cefTRIAXone (ROCEPHIN) 1 g in sodium chloride 0.9 % 100 mL IVPB     1 g 200 mL/hr over 30 Minutes Intravenous  Once 09/14/18 2024 09/14/18 2148   09/14/18 2030  metroNIDAZOLE (FLAGYL) IVPB 500 mg     500 mg 100 mL/hr over 60 Minutes Intravenous  Once 09/14/18 2024 09/14/18 2221       Subjective: Patient seen and evaluated today with no new acute complaints or concerns. No acute concerns or events noted overnight.  She continues to have some moderate right upper quadrant abdominal pain along with some nausea this morning.  Objective: Vitals:   09/14/18 2200 09/14/18 2236 09/15/18 0515 09/15/18 1007  BP: 136/77 (!) 148/86 135/83   Pulse: 62 (!) 59 63 74  Resp:  16 16 15   Temp:  98.1 F (36.7 C) 98.2 F (36.8 C)   TempSrc:  Oral Oral   SpO2: 95% 98% 97% 95%  Weight:  122.5 kg    Height:        Intake/Output Summary (Last 24 hours) at 09/15/2018 1015 Last data filed at 09/15/2018 0900 Gross per 24 hour  Intake 599.81 ml  Output -  Net 599.81 ml   Filed Weights   09/14/18 1649 09/14/18 2236  Weight: 120.7 kg 122.5 kg    Examination:  General  exam: Appears calm and comfortable, obese Respiratory system: Clear to auscultation. Respiratory effort normal. Cardiovascular system: S1 & S2 heard, RRR. No JVD, murmurs, rubs, gallops or clicks. No pedal edema. Gastrointestinal system: Abdomen is nondistended, soft and tender to palpation especially over right upper quadrant. No organomegaly or masses felt. Normal bowel sounds heard. Central nervous system: Alert and oriented. No focal neurological deficits. Extremities: Symmetric 5 x 5 power. Skin: No rashes, lesions or ulcers Psychiatry: Judgement and insight appear normal. Mood & affect  appropriate.     Data Reviewed: I have personally reviewed following labs and imaging studies  CBC: Recent Labs  Lab 09/14/18 2040 09/15/18 0433  WBC 8.6 7.4  NEUTROABS 5.4  --   HGB 12.8 12.1  HCT 40.4 38.4  MCV 87.8 89.1  PLT 380 352   Basic Metabolic Panel: Recent Labs  Lab 09/14/18 2040 09/15/18 0433  NA 138 139  K 3.6 3.8  CL 102 106  CO2 25 27  GLUCOSE 104* 98  BUN 9 8  CREATININE 0.79 0.73  CALCIUM 8.7* 8.5*   GFR: Estimated Creatinine Clearance: 130.1 mL/min (by C-G formula based on SCr of 0.73 mg/dL). Liver Function Tests: Recent Labs  Lab 09/14/18 2040 09/15/18 0433  AST 15 16  ALT 16 16  ALKPHOS 69 66  BILITOT 0.3 0.4  PROT 7.4 6.5  ALBUMIN 3.6 3.2*   Recent Labs  Lab 09/14/18 2040  LIPASE 31   No results for input(s): AMMONIA in the last 168 hours. Coagulation Profile: No results for input(s): INR, PROTIME in the last 168 hours. Cardiac Enzymes: No results for input(s): CKTOTAL, CKMB, CKMBINDEX, TROPONINI in the last 168 hours. BNP (last 3 results) No results for input(s): PROBNP in the last 8760 hours. HbA1C: No results for input(s): HGBA1C in the last 72 hours. CBG: No results for input(s): GLUCAP in the last 168 hours. Lipid Profile: No results for input(s): CHOL, HDL, LDLCALC, TRIG, CHOLHDL, LDLDIRECT in the last 72 hours. Thyroid Function Tests: No results for input(s): TSH, T4TOTAL, FREET4, T3FREE, THYROIDAB in the last 72 hours. Anemia Panel: No results for input(s): VITAMINB12, FOLATE, FERRITIN, TIBC, IRON, RETICCTPCT in the last 72 hours. Sepsis Labs: No results for input(s): PROCALCITON, LATICACIDVEN in the last 168 hours.  Recent Results (from the past 240 hour(s))  SARS Coronavirus 2 [facility](Hospital order, Performed in [facility] hospital lab) Nasopharyngeal Nasopharyngeal Swab     Status: None   Collection Time: 09/14/18  9:41 PM   Specimen: Nasopharyngeal Swab  Result Value Ref Range Status   SARS Coronavirus 2 NEGATIVE  NEGATIVE Final    Comment: (NOTE) If result is NEGATIVE SARS-CoV-2 target nucleic acids are NOT DETECTED. The SARS-CoV-2 RNA is generally detectable in upper and lower  respiratory specimens during the acute phase of infection. The lowest  concentration of SARS-CoV-2 viral copies this assay can detect is 250  copies / mL. A negative result does not preclude SARS-CoV-2 infection  and should not be used as the sole basis for treatment or other  patient management decisions.  A negative result may occur with  improper specimen collection / handling, submission of specimen other  than nasopharyngeal swab, presence of viral mutation(s) within the  areas targeted by this assay, and inadequate number of viral copies  (<250 copies / mL). A negative result must be combined with clinical  observations, patient history, and epidemiological information. If result is POSITIVE SARS-CoV-2 target nucleic acids are DETECTED. The SARS-CoV-2 RNA is generally detectable in upper  and lower  respiratory specimens dur ing the acute phase of infection.  Positive  results are indicative of active infection with SARS-CoV-2.  Clinical  correlation with patient history and other diagnostic information is  necessary to determine patient infection status.  Positive results do  not rule out bacterial infection or co-infection with other viruses. If result is PRESUMPTIVE POSTIVE SARS-CoV-2 nucleic acids MAY BE PRESENT.   A presumptive positive result was obtained on the submitted specimen  and confirmed on repeat testing.  While 2019 novel coronavirus  (SARS-CoV-2) nucleic acids may be present in the submitted sample  additional confirmatory testing may be necessary for epidemiological  and / or clinical management purposes  to differentiate between  SARS-CoV-2 and other Sarbecovirus currently known to infect humans.  If clinically indicated additional testing with an alternate test  methodology [phone]([id number]) is  advised. The SARS-CoV-2 RNA is generally  detectable in upper and lower respiratory sp ecimens during the acute  phase of infection. The expected result is Negative. Fact Sheet for Patients:  BoilerBrush.com.cyhttps://www.fda.gov/media/136312/download Fact Sheet for Healthcare Providers: https://pope.com/https://www.fda.gov/media/136313/download This test is not yet approved or cleared by the [place] FDA and has been authorized for detection and/or diagnosis of SARS-CoV-2 by FDA under an Emergency Use Authorization (EUA).  This EUA will remain in effect (meaning this test can be used) for the duration of the COVID-19 declaration under Section 564(b)(1) of the Act, 21 U.S.C. section 360bbb-3(b)(1), unless the authorization is terminated or revoked sooner. Performed at [facility], [address] [place], [place] [address]          Radiology Studies: Koreas Abdomen Limited Ruq  Result Date: 09/14/2018 CLINICAL DATA:  Right upper quadrant pain EXAM: ULTRASOUND ABDOMEN LIMITED RIGHT UPPER QUADRANT COMPARISON:  None. FINDINGS: Gallbladder: Within the gallbladder, there is a focal calculus imbedded in the neck of the gallbladder measuring 2.3 cm. There is also sludge in the gallbladder. Gallbladder wall is mildly thickened. There is no pericholecystic fluid. Patient is focally tender over the gallbladder. Common bile duct: Diameter: 3 mm. No intrahepatic or extrahepatic biliary duct dilatation. Liver: No focal lesion identified. Liver echogenicity is increased diffusely. Portal vein is patent on color Doppler imaging with normal direction of blood flow towards the liver. Other: None. IMPRESSION: 1. 2.3 cm in size gallstone imbedded in the neck of the gallbladder. Gallbladder wall mildly thickened. Patient is focally tender over the gallbladder. These are findings concerning for acute cholecystitis. 2. Diffuse increase in liver echogenicity, a finding indicative of hepatic steatosis. While no focal liver lesions are evident on  this study, it must be cautioned that the sensitivity of ultrasound for detection of focal liver lesions is diminished in this circumstance. These results will be called to the ordering clinician or representative by the Radiologist Assistant, and communication documented in the PACS or zVision Dashboard. Electronically Signed   By: Bretta BangWilliam  Woodruff III M.D.   On: 09/14/2018 15:02        Scheduled Meds: . Chlorhexidine Gluconate Cloth  6 each Topical Once   Continuous Infusions: . sodium chloride 100 mL/hr at 09/14/18 2256  . cefoTEtan (CEFOTAN) IV    . [MAR Hold] cefTRIAXone (ROCEPHIN)  IV    . lactated ringers       LOS: 1 day    Time spent: 30 minutes    Fouad Taul Hoover BrunetteD Rebecca Motta, DO [facility] Pager [phone]  If 7PM-7AM, please contact night-coverage [email or web address] Password [email or web address] 09/15/2018, 10:15 AM

## 2018-09-15 NOTE — Consult Note (Signed)
[facility] Consult  Reason for Consult: Acute cholecystitis  Referring Physician:  Dr. Juleen ChinaKohut, MD (ED)   Chief Complaint    Abnormal Lab      HPI: Sheryl RobertsShannon [name] is a 38 y.o. female with findings of acute cholecystitis. The patient reported a history of 5 weeks or more of RUQ abdominal pain with associated nausea/vomiting that has progressively gotten worse in the last few days. The pain is associated with eating especially greasy foods. She otherwise denies any fevers, chills, chest pain or SOB. She was admitted overnight for IV antibiotics and cholecystectomy today.   Past Medical History:  Diagnosis Date  . History of HPV infection   . Hypertension   . Migraines   . Vaginal Pap smear, abnormal     Past Surgical History:  Procedure Laterality Date  . DILITATION & CURRETTAGE/HYSTROSCOPY WITH NOVASURE ABLATION N/A 11/07/2014   Procedure: DILATATION & CURETTAGE/HYSTEROSCOPY WITH NOVASURE ABLATION  Uterine Cavity Length 6.0cm Uterine Cavity Width 4.6cm Power 152 watts 49 sec is Time;  Surgeon: Lazaro ArmsLuther H Eure, MD;  Location: [facility];  Service: Gynecology;  Laterality: N/A;  . LEEP    . TONSILLECTOMY N/A 09/21/2016   Procedure: TONSILLECTOMY;  Surgeon: Serena Colonelosen, Jefry, MD;  Location: [facility];  Service: ENT;  Laterality: N/A;    Family History  Problem Relation Age of Onset  . Heart attack Paternal Grandfather   . Heart attack Paternal Grandmother   . Cancer Maternal Grandmother        pancreatic  . Other Maternal Grandfather        black lung  . Hypertension Father   . Diabetes Father   . Heart disease Father   . Other Father        bypass surgeries in legs-veins  . Cancer Mother     Social History   Tobacco Use  . Smoking status: Never Smoker  . Smokeless tobacco: Never Used  Substance Use Topics  . Alcohol use: No  . Drug use: No    Medications:  I have reviewed the patient's current medications. Prior to Admission:  Medications  Prior to Admission  Medication Sig Dispense Refill Last Dose  . bisoprolol-hydrochlorothiazide (ZIAC) 2.5-6.25 MG tablet Take 3 tablets by mouth daily.      Marland Kitchen. HYDROcodone-acetaminophen (HYCET) 7.5-325 mg/15 ml solution Take 15 mLs by mouth 4 (four) times daily as needed for moderate pain. 480 mL 0   . promethazine (PHENERGAN) 25 MG suppository Place 1 suppository (25 mg total) rectally every 6 (six) hours as needed for nausea or vomiting. 12 suppository 1   . SUMAtriptan (IMITREX) 25 MG tablet Take 25 mg by mouth every 2 (two) hours as needed for migraine. May repeat in 2 hours if headache persists or recurs.      Scheduled:  Continuous: . sodium chloride 100 mL/hr at 09/14/18 2256  . cefTRIAXone (ROCEPHIN)  IV     NWG:NFAOZHYQMVHPRN:hydrALAZINE, HYDROcodone-acetaminophen, ondansetron **OR** ondansetron (ZOFRAN) IV, senna-docusate, SUMAtriptan  No Known Allergies   ROS:  A comprehensive review of systems was negative except for: Gastrointestinal: positive for abdominal pain, nausea and vomiting  Blood pressure 135/83, pulse 63, temperature 98.2 F (36.8 C), temperature source Oral, resp. rate 16, height 5\' 8"  (1.727 m), weight 122.5 kg, last menstrual period 09/05/2018, SpO2 97 %. Physical Exam Vitals signs reviewed.  Constitutional:      Appearance: Normal appearance.  HENT:     Head: Normocephalic and atraumatic.     Nose: Nose normal.  Mouth/Throat:     Mouth: Mucous membranes are moist.  Eyes:     Extraocular Movements: Extraocular movements intact.     Pupils: Pupils are equal, round, and reactive to light.  Neck:     Musculoskeletal: Normal range of motion.  Cardiovascular:     Rate and Rhythm: Normal rate and regular rhythm.  Pulmonary:     Effort: Pulmonary effort is normal.     Breath sounds: Normal breath sounds.  Abdominal:     General: There is no distension.     Palpations: Abdomen is soft.     Tenderness: There is abdominal tenderness in the right upper quadrant and  epigastric area.  Musculoskeletal:        General: No swelling.  Skin:    General: Skin is warm and dry.  Neurological:     General: No focal deficit present.     Mental Status: She is alert and oriented to person, place, and time.  Psychiatric:        Mood and Affect: Mood normal.        Behavior: Behavior normal.        Thought Content: Thought content normal.        Judgment: Judgment normal.     Results: Results for orders placed or performed during the hospital encounter of 09/14/18 (from the past 48 hour(s))  Comprehensive metabolic panel     Status: Abnormal   Collection Time: 09/14/18  8:40 PM  Result Value Ref Range   Sodium 138 135 - 145 mmol/L   Potassium 3.6 3.5 - 5.1 mmol/L   Chloride 102 98 - 111 mmol/L   CO2 25 22 - 32 mmol/L   Glucose, Bld 104 (H) 70 - 99 mg/dL   BUN 9 6 - 20 mg/dL   Creatinine, Ser 9.140.79 0.44 - 1.00 mg/dL   Calcium 8.7 (L) 8.9 - 10.3 mg/dL   Total Protein 7.4 6.5 - 8.1 g/dL   Albumin 3.6 3.5 - 5.0 g/dL   AST 15 15 - 41 U/L   ALT 16 0 - 44 U/L   Alkaline Phosphatase 69 38 - 126 U/L   Total Bilirubin 0.3 0.3 - 1.2 mg/dL   GFR calc non Af Amer >60 >60 mL/min   GFR calc Af Amer >60 >60 mL/min   Anion gap 11 5 - 15    Comment: Performed at [facility], [address] [place], [place] [address]  CBC with Differential     Status: None   Collection Time: 09/14/18  8:40 PM  Result Value Ref Range   WBC 8.6 4.0 - 10.5 K/uL   RBC 4.60 3.87 - 5.11 MIL/uL   Hemoglobin 12.8 12.0 - 15.0 g/dL   HCT 62.140.4 30.836.0 - 65.746.0 %   MCV 87.8 80.0 - 100.0 fL   MCH 27.8 26.0 - 34.0 pg   MCHC 31.7 30.0 - 36.0 g/dL   RDW 84.613.1 96.211.5 - 95.215.5 %   Platelets 380 150 - 400 K/uL   nRBC 0.0 0.0 - 0.2 %   Neutrophils Relative % 61 %   Neutro Abs 5.4 1.7 - 7.7 K/uL   Lymphocytes Relative 27 %   Lymphs Abs 2.3 0.7 - 4.0 K/uL   Monocytes Relative 7 %   Monocytes Absolute 0.6 0.1 - 1.0 K/uL   Eosinophils Relative 3 %   Eosinophils Absolute 0.3 0.0 - 0.5 K/uL   Basophils  Relative 1 %   Basophils Absolute 0.0 0.0 - 0.1 K/uL  Immature Granulocytes 1 %   Abs Immature Granulocytes 0.04 0.00 - 0.07 K/uL    Comment: Performed at [facility], [address] [place], [place] [address]  Lipase, blood     Status: None   Collection Time: 09/14/18  8:40 PM  Result Value Ref Range   Lipase 31 11 - 51 U/L    Comment: Performed at [facility], [address], [place], [place] [address]  Pregnancy, urine     Status: None   Collection Time: 09/14/18  8:55 PM  Result Value Ref Range   Preg Test, Ur NEGATIVE NEGATIVE    Comment:        THE SENSITIVITY OF THIS METHODOLOGY IS >20 mIU/mL. Performed at [facility], [address] [place], [place] [address]   SARS Coronavirus 2 [facility] order, Performed in [facility] hospital lab) Nasopharyngeal Nasopharyngeal Swab     Status: None   Collection Time: 09/14/18  9:41 PM   Specimen: Nasopharyngeal Swab  Result Value Ref Range   SARS Coronavirus 2 NEGATIVE NEGATIVE    Comment: (NOTE) If result is NEGATIVE SARS-CoV-2 target nucleic acids are NOT DETECTED. The SARS-CoV-2 RNA is generally detectable in upper and lower  respiratory specimens during the acute phase of infection. The lowest  concentration of SARS-CoV-2 viral copies this assay can detect is 250  copies / mL. A negative result does not preclude SARS-CoV-2 infection  and should not be used as the sole basis for treatment or other  patient management decisions.  A negative result may occur with  improper specimen collection / handling, submission of specimen other  than nasopharyngeal swab, presence of viral mutation(s) within the  areas targeted by this assay, and inadequate number of viral copies  (<250 copies / mL). A negative result must be combined with clinical  observations, patient history, and epidemiological information. If result is POSITIVE SARS-CoV-2 target nucleic acids are DETECTED. The SARS-CoV-2 RNA is generally detectable in upper and lower   respiratory specimens dur ing the acute phase of infection.  Positive  results are indicative of active infection with SARS-CoV-2.  Clinical  correlation with patient history and other diagnostic information is  necessary to determine patient infection status.  Positive results do  not rule out bacterial infection or co-infection with other viruses. If result is PRESUMPTIVE POSTIVE SARS-CoV-2 nucleic acids MAY BE PRESENT.   A presumptive positive result was obtained on the submitted specimen  and confirmed on repeat testing.  While 2019 novel coronavirus  (SARS-CoV-2) nucleic acids may be present in the submitted sample  additional confirmatory testing may be necessary for epidemiological  and / or clinical management purposes  to differentiate between  SARS-CoV-2 and other Sarbecovirus currently known to infect humans.  If clinically indicated additional testing with an alternate test  methodology [phone]) is advised. The SARS-CoV-2 RNA is generally  detectable in upper and lower respiratory sp ecimens during the acute  phase of infection. The expected result is Negative. Fact Sheet for Patients:  StrictlyIdeas.no Fact Sheet for Healthcare Providers: BankingDealers.co.za This test is not yet approved or cleared by the [place] FDA and has been authorized for detection and/or diagnosis of SARS-CoV-2 by FDA under an Emergency Use Authorization (EUA).  This EUA will remain in effect (meaning this test can be used) for the duration of the COVID-19 declaration under Section 564(b)(1) of the Act, 21 U.S.C. section 360bbb-3(b)(1), unless the authorization is terminated or revoked sooner. Performed at [facility], [address], [place],  [place] [address]   Comprehensive metabolic panel     Status: Abnormal   Collection Time: 09/15/18  4:33 AM  Result Value Ref Range   Sodium 139 135 - 145 mmol/L   Potassium 3.8 3.5 - 5.1 mmol/L    Chloride 106 98 - 111 mmol/L   CO2 27 22 - 32 mmol/L   Glucose, Bld 98 70 - 99 mg/dL   BUN 8 6 - 20 mg/dL   Creatinine, Ser 6.04 0.44 - 1.00 mg/dL   Calcium 8.5 (L) 8.9 - 10.3 mg/dL   Total Protein 6.5 6.5 - 8.1 g/dL   Albumin 3.2 (L) 3.5 - 5.0 g/dL   AST 16 15 - 41 U/L   ALT 16 0 - 44 U/L   Alkaline Phosphatase 66 38 - 126 U/L   Total Bilirubin 0.4 0.3 - 1.2 mg/dL   GFR calc non Af Amer >60 >60 mL/min   GFR calc Af Amer >60 >60 mL/min   Anion gap 6 5 - 15    Comment: Performed at [facility], [address], [place], [place] [address]  CBC     Status: None   Collection Time: 09/15/18  4:33 AM  Result Value Ref Range   WBC 7.4 4.0 - 10.5 K/uL   RBC 4.31 3.87 - 5.11 MIL/uL   Hemoglobin 12.1 12.0 - 15.0 g/dL   HCT 11.9 14.7 - 82.9 %   MCV 89.1 80.0 - 100.0 fL   MCH 28.1 26.0 - 34.0 pg   MCHC 31.5 30.0 - 36.0 g/dL   RDW 56.2 13.0 - 86.5 %   Platelets 352 150 - 400 K/uL   nRBC 0.0 0.0 - 0.2 %    Comment: Performed at [facility], [address], [place], [place] [address]   Personally reviewed [place]- large stone in the gallbladder, some thickening of the wall, CBD 3mm US Abdomen Limited Ruq  Result Date: 09/14/2018 CLINICAL DATA:  Right upper quadrant pain EXAM: ULTRASOUND ABDOMEN LIMITED RIGHT UPPER QUADRANT COMPARISON:  None. FINDINGS: Gallbladder: Within the gallbladder, there is a focal calculus imbedded in the neck of the gallbladder measuring 2.3 cm. There is also sludge in the gallbladder. Gallbladder wall is mildly thickened. There is no pericholecystic fluid. Patient is focally tender over the gallbladder. Common bile duct: Diameter: 3 mm. No intrahepatic or extrahepatic biliary duct dilatation. Liver: No focal lesion identified. Liver echogenicity is increased diffusely. Portal vein is patent on color Doppler imaging with normal direction of blood flow towards the liver. Other: None. IMPRESSION: 1. 2.3 cm in size gallstone imbedded in the neck of the gallbladder. Gallbladder wall  mildly thickened. Patient is focally tender over the gallbladder. These are findings concerning for acute cholecystitis. 2. Diffuse increase in liver echogenicity, a finding indicative of hepatic steatosis. While no focal liver lesions are evident on this study, it must be cautioned that the sensitivity of ultrasound for detection of focal liver lesions is diminished in this circumstance. These results will be called to the ordering clinician or representative by the Radiologist Assistant, and communication documented in the PACS or zVision Dashboard. Electronically Signed   By: Bretta Bang III M.D.   On: 09/14/2018 15:02     Assessment & Plan:  Sheryl [name] is a 38 y.o. female with findings concerning for acute cholecystitis. She has been having issues with her gallbladder for several weeks.   -PLAN: I counseled the patient about the indication, risks and benefits of laparoscopic cholecystectomy.  She understands there is a  very small chance for bleeding, infection, injury to normal structures (including common bile duct), conversion to open surgery, persistent symptoms, evolution of postcholecystectomy diarrhea, need for secondary interventions, anesthesia reaction, cardiopulmonary issues and other risks not specifically detailed here. I described the expected recovery, the plan for follow-up and the restrictions during the recovery phase.  All questions were answered.  -Lap cholecystectomy possible open  - COVID negative   All questions were answered to the satisfaction of the patient.    Sheryl [name] 09/15/2018, 8:18 AM

## 2018-09-15 NOTE — Anesthesia Preprocedure Evaluation (Signed)
Anesthesia Evaluation  Patient identified by MRN, date of birth, ID band Patient awake  General Assessment Comment:Requests scop patch -has used in the past with success  Reviewed: Allergy & Precautions, NPO status , Patient's Chart, lab work & pertinent test results, reviewed documented beta blocker date and time   History of Anesthesia Complications (+) PONV and history of anesthetic complications  Airway Mallampati: III  TM Distance: >3 FB Neck ROM: Full    Dental no notable dental hx. (+) Teeth Intact   Pulmonary neg pulmonary ROS,    Pulmonary exam normal breath sounds clear to auscultation       Cardiovascular Exercise Tolerance: Good hypertension, Pt. on home beta blockers and Pt. on medications negative cardio ROS Normal cardiovascular examI Rhythm:Regular Rate:Normal     Neuro/Psych  Headaches, negative psych ROS   GI/Hepatic negative GI ROS, Neg liver ROS,   Endo/Other  Morbid obesity  Renal/GU negative Renal ROS  negative genitourinary   Musculoskeletal negative musculoskeletal ROS (+)   Abdominal   Peds negative pediatric ROS (+)  Hematology negative hematology ROS (+)   Anesthesia Other Findings   Reproductive/Obstetrics negative OB ROS                             Anesthesia Physical Anesthesia Plan  ASA: III  Anesthesia Plan: General   Post-op Pain Management:    Induction: Intravenous  PONV Risk Score and Plan: 3 and Scopolamine patch - Pre-op, Midazolam, Dexamethasone, Ondansetron, Treatment may vary due to age or medical condition and Promethazine  Airway Management Planned: Oral ETT  Additional Equipment:   Intra-op Plan:   Post-operative Plan: Extubation in OR  Informed Consent: I have reviewed the patients History and Physical, chart, labs and discussed the procedure including the risks, benefits and alternatives for the proposed anesthesia with the  patient or authorized representative who has indicated his/her understanding and acceptance.     Dental advisory given  Plan Discussed with: CRNA  Anesthesia Plan Comments: (Plan Full PPE use  Plan GETA-d/w pt -WTP with same after Q&A)        Anesthesia Quick Evaluation

## 2018-09-15 NOTE — Anesthesia Postprocedure Evaluation (Signed)
Anesthesia Post Note  Patient: Sheryl [name]  Procedure(s) Performed: LAPAROSCOPIC CHOLECYSTECTOMY (N/A )  Patient location during evaluation: PACU Anesthesia Type: General Level of consciousness: awake and alert, oriented and patient cooperative Pain management: pain level controlled Vital Signs Assessment: post-procedure vital signs reviewed and stable Respiratory status: spontaneous breathing Cardiovascular status: stable Postop Assessment: no apparent nausea or vomiting Anesthetic complications: no     Last Vitals:  Vitals:   09/15/18 1007 09/15/18 1224  BP:  (!) 162/100  Pulse: 74 88  Resp: 15 (!) 31  Temp:  37.2 C  SpO2: 95% 93%    Last Pain:  Vitals:   09/15/18 1224  TempSrc:   PainSc: Asleep                 Sheryl [name] A

## 2018-09-15 NOTE — Transfer of Care (Signed)
Immediate Anesthesia Transfer of Care Note  Patient: Sheryl [name]  Procedure(s) Performed: LAPAROSCOPIC CHOLECYSTECTOMY (N/A )  Patient Location: PACU  Anesthesia Type:General  Level of Consciousness: drowsy and patient cooperative  Airway & Oxygen Therapy: Patient Spontanous Breathing  Post-op Assessment: Report given to RN and Post -op Vital signs reviewed and stable  Post vital signs: Reviewed and stable  Last Vitals:  Vitals Value Taken Time  BP 162/100 09/15/18 1224  Temp    Pulse 88 09/15/18 1226  Resp 16 09/15/18 1226  SpO2 92 % 09/15/18 1226  Vitals shown include unvalidated device data.  Last Pain:  Vitals:   09/15/18 1007  TempSrc:   PainSc: 0-No pain         Complications: No apparent anesthesia complications

## 2018-09-15 NOTE — Anesthesia Procedure Notes (Signed)
Procedure Name: Intubation Date/Time: 09/15/2018 10:54 AM Performed by: Jonna Munro, CRNA Pre-anesthesia Checklist: Patient identified, Emergency Drugs available, Suction available, Patient being monitored and Timeout performed Patient Re-evaluated:Patient Re-evaluated prior to induction Oxygen Delivery Method: Circle system utilized Preoxygenation: Pre-oxygenation with 100% oxygen Induction Type: IV induction, Rapid sequence and Cricoid Pressure applied Laryngoscope Size: Mac and 3 Grade View: Grade I Tube type: Oral Tube size: 7.0 mm Number of attempts: 1 Airway Equipment and Method: Stylet Secured at: 22 cm Tube secured with: Tape Dental Injury: Teeth and Oropharynx as per pre-operative assessment

## 2018-09-15 NOTE — Op Note (Signed)
Operative Note   Preoperative Diagnosis: Acute cholecystitis    Postoperative Diagnosis: Acute cholecystitis with hydrops   Procedure(s) Performed: Laparoscopic cholecystectomy   Surgeon: Lanell Matar. Constance Haw, MD   Assistants: No qualified resident was available   Anesthesia: General endotracheal   Anesthesiologist: Lenice Llamas, MD    Specimens: Gallbladder    Estimated Blood Loss: Minimal    Blood Replacement: None    Complications: None    Operative Findings:  Distended, inflamed gallbladder with hydrops    Procedure: The patient was taken to the operating room and placed supine. General endotracheal anesthesia was induced. Intravenous antibiotics were administered per protocol. An orogastric tube positioned to decompress the stomach. The abdomen was prepared and draped in the usual sterile fashion.    A supraumbilical incision was made and a Veress technique was utilized to achieve pneumoperitoneum to 15 mmHg with carbon dioxide. A 11 mm optiview port was placed through the supraumbilical region, and a 10 mm 0-degree operative laparoscope was introduced. The area underlying the trocar and Veress needle were inspected and without evidence of injury.  Remaining trocars were placed under direct vision. Two 5 mm ports were placed in the right abdomen, between the anterior axillary and midclavicular line.  A final 11 mm port was placed through the mid-epigastrium, near the falciform ligament.   There was a significant inflammatory rind. I worked on the medial and lateral edge to free the gallbladder from the liver. Then I was able to elevate the gallbladder fundus cephalad and the infundibulum was retracted to the patient's right. The gallbladder/cystic duct junction was skeletonized. The cystic artery noted in the triangle of Calot and was also skeletonized.  We then continued liberal medial and lateral dissection until the critical view of safety was achieved.  The cystic duct was  very thickened. The clips would not go across it and close. A EndoGIA vascular load was used to staple across the cystic duct close to the infundibulum.    The cystic artery were doubly clipped and divided. The gallbladder was then dissected from the liver bed with electrocautery. There was significant inflammatory rind posteriorly, and I entered the lumen of the gallbladder and when going deep got into the liver. I was able to get the entire gallbladder off. A large stone fell out, and I placed this in a Endopouch bag and tucked it above the liver so we did not lose it. The gallbladder was then removed off the remaining liver bed. The specimen was placed in an Endopouch and was retrieved through the epigastric site. The stone was also retrieved through the epigastric site.    Final inspection revealed acceptable hemostasis. Surgical SNOW and Arista were placed in the gallbladder bed. Trocars were removed and pneumoperitoneum was released. 0 Vicryl fascial sutures were used to close the epigastric site. The umbilical site was small and deep.  Skin incisions were closed with 4-0 Monocryl subcuticular sutures and Dermabond. The patient was awakened from anesthesia and extubated without complication.    Curlene Labrum, MD [facility] [address] [place], [place] [address] [phone] (office)

## 2018-09-15 NOTE — Progress Notes (Signed)
Nsg Discharge Note  Admit Date:  09/14/2018 Discharge date: 09/15/2018   Mariea Clonts to be D/C'd Home per MD order.  AVS completed.  Copy for chart, and copy for patient signed, and dated. Patient/caregiver able to verbalize understanding. Removed IV-clean, dry, intact. Wheeled stable patient and belongings to main entrance where she was picked up by her boyfriend. Discharge Medication: Allergies as of 09/15/2018   No Known Allergies     Medication List    STOP taking these medications   HYDROcodone-acetaminophen 7.5-325 mg/15 ml solution Commonly known as: HYCET     TAKE these medications   bisoprolol-hydrochlorothiazide 2.5-6.25 MG tablet Commonly known as: ZIAC Take 3 tablets by mouth daily.   docusate sodium 100 MG capsule Commonly known as: COLACE Take 1 capsule (100 mg total) by mouth 2 (two) times daily.   ondansetron 4 MG tablet Commonly known as: ZOFRAN Take 1 tablet (4 mg total) by mouth every 6 (six) hours as needed for nausea.   oxyCODONE 5 MG immediate release tablet Commonly known as: Oxy IR/ROXICODONE Take 1 tablet (5 mg total) by mouth every 4 (four) hours as needed for moderate pain.   promethazine 25 MG suppository Commonly known as: PHENERGAN Place 1 suppository (25 mg total) rectally every 6 (six) hours as needed for nausea or vomiting.   SUMAtriptan 25 MG tablet Commonly known as: IMITREX Take 25 mg by mouth every 2 (two) hours as needed for migraine. May repeat in 2 hours if headache persists or recurs.       Discharge Assessment: Vitals:   09/15/18 1315 09/15/18 1421  BP: (!) 156/94 (!) 146/91  Pulse: 87 75  Resp: 15 16  Temp:  98.2 F (36.8 C)  SpO2: 91% 92%   Skin clean, dry and intact without evidence of skin break down, no evidence of skin tears noted. IV catheter discontinued intact. Site without signs and symptoms of complications - no redness or edema noted at insertion site, patient denies c/o pain - only slight tenderness at  site.  Dressing with slight pressure applied.  D/c Instructions-Education: Discharge instructions given to patient/family with verbalized understanding. D/c education completed with patient/family including follow up instructions, medication list, d/c activities limitations if indicated, with other d/c instructions as indicated by MD - patient able to verbalize understanding, all questions fully answered. Patient instructed to return to ED, call 911, or call MD for any changes in condition.  Patient escorted via [place], and D/C home via private auto.  Santa Lighter, RN 09/15/2018 7:09 PM

## 2018-09-15 NOTE — Discharge Summary (Signed)
Physician Discharge Summary  Patient ID: Sheryl [name] MRN: [id number] DOB/AGE: 04/19/1980 38 y.o.  Admit date: 09/14/2018 Discharge date: 09/15/2018  Admission Diagnoses: Acute cholecystitis   Discharge Diagnoses:  Active Problems:   Acute cholecystitis   Gallbladder hydrops   Discharged Condition: good  Hospital Course: Ms. Sheryl [name] is a 38 yo admitted through the ED with acute cholecystitis. She was started on antibiotics, and she underwent laparoscopic cholecystectomy today. Post operatively she went to the floor, and had good pain control, was tolerating a diet, and ambulating and wanted to go home.  She was discharged home in stable condition.   Consults: Hospitalist admitted overnight, taken over by surgery post op   Significant Diagnostic Studies: [place]- acute cholecystitis with a large stone in the neck of the gallbladder, normal CBD   Treatments: Surgery: 8/13 Laparoscopic cholecystectomy   Discharge Exam: Blood pressure (!) 146/91, pulse 75, temperature 98.2 F (36.8 [name]), temperature source Oral, resp. rate 16, height 5\' 8"  (1.727 m), weight 270 lb 1 oz (122.5 kg), last menstrual period 09/05/2018, SpO2 92 %.   Disposition: Discharge disposition: 01-Home or Self Care       Discharge Instructions    Call MD for:  difficulty breathing, headache or visual disturbances   Complete by: As directed    Call MD for:  extreme fatigue   Complete by: As directed    Call MD for:  persistant dizziness or light-headedness   Complete by: As directed    Call MD for:  persistant nausea and vomiting   Complete by: As directed    Call MD for:  redness, tenderness, or signs of infection (pain, swelling, redness, odor or green/yellow discharge around incision site)   Complete by: As directed    Call MD for:  severe uncontrolled pain   Complete by: As directed    Call MD for:  temperature >100.4   Complete by: As directed    Increase activity slowly   Complete by: As directed       Allergies as of 09/15/2018   No Known Allergies     Medication List    STOP taking these medications   HYDROcodone-acetaminophen 7.5-325 mg/15 ml solution Commonly known as: HYCET     TAKE these medications   bisoprolol-hydrochlorothiazide 2.5-6.25 MG tablet Commonly known as: ZIAC Take 3 tablets by mouth daily.   docusate sodium 100 MG capsule Commonly known as: COLACE Take 1 capsule (100 mg total) by mouth 2 (two) times daily.   ondansetron 4 MG tablet Commonly known as: ZOFRAN Take 1 tablet (4 mg total) by mouth every 6 (six) hours as needed for nausea.   oxyCODONE 5 MG immediate release tablet Commonly known as: Oxy IR/ROXICODONE Take 1 tablet (5 mg total) by mouth every 4 (four) hours as needed for moderate pain.   promethazine 25 MG suppository Commonly known as: PHENERGAN Place 1 suppository (25 mg total) rectally every 6 (six) hours as needed for nausea or vomiting.   SUMAtriptan 25 MG tablet Commonly known as: IMITREX Take 25 mg by mouth every 2 (two) hours as needed for migraine. May repeat in 2 hours if headache persists or recurs.      Follow-up Information    Carolee Rota, NP Follow up on 09/26/2018.   Specialty: Nurse Practitioner Why: Monday at 10:30 for your hosital follow up appointment. This is with Cyril Mourning. Contact information: [place] [address] [phone]        Virl Cagey, MD Follow up on  10/04/2018.   Specialty: General Surgery Why: Dr. Henreitta Leber will call you for a post op phone call; if you have issues and need to be seen in person, call the office.  Contact information: [address] [place] [address] [phone]           Signed: Lucretia RoersLindsay [name]  09/15/2018, 9:25 PM

## 2018-09-15 NOTE — Progress Notes (Signed)
[facility]  Talked to Mr. Tosto, significant other. Gallbladder removed. Will see how patient feels and determine if she goes home today or tomorrow.  Curlene Labrum, MD [facility] [address] [place], [place] [address] [phone] (office)

## 2018-09-15 NOTE — Plan of Care (Signed)
  Problem: Education: Goal: Knowledge of General Education information will improve Description Including pain rating scale, medication(s)/side effects and non-pharmacologic comfort measures Outcome: Progressing   Problem: Health Behavior/Discharge Planning: Goal: Ability to manage health-related needs will improve Outcome: Progressing   

## 2018-09-15 NOTE — Plan of Care (Signed)
  Problem: Education: Goal: Knowledge of General Education information will improve Description: Including pain rating scale, medication(s)/side effects and non-pharmacologic comfort measures 09/15/2018 1908 by Santa Lighter, RN Outcome: Adequate for Discharge 09/15/2018 1908 by Santa Lighter, RN Outcome: Progressing   Problem: Health Behavior/Discharge Planning: Goal: Ability to manage health-related needs will improve 09/15/2018 1908 by Santa Lighter, RN Outcome: Adequate for Discharge 09/15/2018 1908 by Santa Lighter, RN Outcome: Progressing   Problem: Clinical Measurements: Goal: Ability to maintain clinical measurements within normal limits will improve Outcome: Adequate for Discharge Goal: Will remain free from infection Outcome: Adequate for Discharge Goal: Diagnostic test results will improve Outcome: Adequate for Discharge Goal: Respiratory complications will improve Outcome: Adequate for Discharge Goal: Cardiovascular complication will be avoided Outcome: Adequate for Discharge   Problem: Nutrition: Goal: Adequate nutrition will be maintained Outcome: Adequate for Discharge   Problem: Activity: Goal: Risk for activity intolerance will decrease Outcome: Adequate for Discharge   Problem: Coping: Goal: Level of anxiety will decrease Outcome: Adequate for Discharge   Problem: Elimination: Goal: Will not experience complications related to bowel motility Outcome: Adequate for Discharge Goal: Will not experience complications related to urinary retention Outcome: Adequate for Discharge   Problem: Pain Managment: Goal: General experience of comfort will improve Outcome: Adequate for Discharge   Problem: Safety: Goal: Ability to remain free from injury will improve Outcome: Adequate for Discharge   Problem: Skin Integrity: Goal: Risk for impaired skin integrity will decrease Outcome: Adequate for Discharge

## 2018-09-15 NOTE — Discharge Instructions (Signed)
Discharge Laparoscopic Surgery Instructions: ° °Common Complaints: °Right shoulder pain is common after laparoscopic surgery. This is secondary to the gas used in the surgery being trapped under the diaphragm.  °Walk to help your body absorb the gas. This will improve in a few days. °Pain at the port sites are common, especially the larger port sites. This will improve with time.  °Some nausea is common and poor appetite. The main goal is to stay hydrated the first few days after surgery.  ° °Diet/ Activity: °Diet as tolerated. You may not have an appetite, but it is important to stay hydrated. Drink 64 ounces of water a day. Your appetite will return with time.  °Shower per your regular routine daily.  Do not take hot showers. Take warm showers that are less than 10 minutes. °Rest and listen to your body, but do not remain in bed all day.  °Walk everyday for at least 15-20 minutes. Deep cough and move around every 1-2 hours in the first few days after surgery.  °Do not lift > 10 lbs, perform excessive bending, pushing, pulling, squatting for 1-2 weeks after surgery.  °Do not pick at the dermabond glue on your incision sites.  This glue film will remain in place for 1-2 weeks and will start to peel off.  °Do not place lotions or balms on your incision unless instructed to specifically by Dr. Ava Tangney.  ° °Medication: °Take tylenol and ibuprofen as needed for pain control, alternating every 4-6 hours.  °Example:  °Tylenol 1000mg @ 6am, 12noon, 6pm, 12midnight (Do not exceed 4000mg of tylenol a day). Ibuprofen 800mg @ 9am, 3pm, 9pm, 3am (Do not exceed 3600mg of ibuprofen a day).  °Take Roxicodone for breakthrough pain every 4 hours.  °Take Colace for constipation related to narcotic pain medication. If you do not have a bowel movement in 2 days, take Miralax over the counter.  °Drink plenty of water to also prevent constipation.  ° °Contact Information: °If you have questions or concerns, please call our office,  [phone], Monday- Thursday 8AM-5PM and Friday 8AM-12Noon.  °If it is after hours or on the weekend, please call [facility] Main Number, [phone], and ask to speak to the surgeon on call for Dr. Fantasia Jinkins at [place].  ° ° °Laparoscopic Cholecystectomy, Care After °This sheet gives you information about how to care for yourself after your procedure. Your doctor may also give you more specific instructions. If you have problems or questions, contact your doctor. °Follow these instructions at home: °Care for cuts from surgery (incisions) ° °· Follow instructions from your doctor about how to take care of your cuts from surgery. Make sure you: °? Wash your hands with soap and water before you change your bandage (dressing). If you cannot use soap and water, use hand sanitizer. °? Change your bandage as told by your doctor. °? Leave stitches (sutures), skin glue, or skin tape (adhesive) strips in place. They may need to stay in place for 2 weeks or longer. If tape strips get loose and curl up, you may trim the loose edges. Do not remove tape strips completely unless your doctor says it is okay. °· Do not take baths, swim, or use a hot tub until your doctor says it is okay.  °· You may shower. °· Check your surgical cut area every day for signs of infection. Check for: °? More redness, swelling, or pain. °? More fluid or blood. °? Warmth. °? Pus or a bad smell. °Activity °· Do   not drive or use heavy machinery while taking prescription pain medicine. °· Do not lift anything that is heavier than 10 lb (4.5 kg) until your doctor says it is okay. °· Do not play contact sports until your doctor says it is okay. °· Do not drive for 24 hours if you were given a medicine to help you relax (sedative). °· Rest as needed. Do not return to work or school until your doctor says it is okay. °General instructions °· Take over-the-counter and prescription medicines only as told by your doctor. °· To prevent or treat constipation  while you are taking prescription pain medicine, your doctor may recommend that you: °? Drink enough fluid to keep your pee (urine) clear or pale yellow. °? Take over-the-counter or prescription medicines. °? Eat foods that are high in fiber, such as fresh fruits and vegetables, whole grains, and beans. °? Limit foods that are high in fat and processed sugars, such as fried and sweet foods. °Contact a doctor if: °· You develop a rash. °· You have more redness, swelling, or pain around your surgical cuts. °· You have more fluid or blood coming from your surgical cuts. °· Your surgical cuts feel warm to the touch. °· You have pus or a bad smell coming from your surgical cuts. °· You have a fever. °· One or more of your surgical cuts breaks open. °Get help right away if: °· You have trouble breathing. °· You have chest pain. °· You have pain that is getting worse in your shoulders. °· You faint or feel dizzy when you stand. °· You have very bad pain in your belly (abdomen). °· You are sick to your stomach (nauseous) for more than one day. °· You have throwing up (vomiting) that lasts for more than one day. °· You have leg pain. °This information is not intended to replace advice given to you by your health care provider. Make sure you discuss any questions you have with your health care provider. °Document Released: 10/29/2007 Document Revised: 01/01/2017 Document Reviewed: 07/08/2015 °Elsevier Patient Education © 2020 [employer] ° °

## 2018-09-15 NOTE — Progress Notes (Signed)
[facility]  RN called and said patient pain is controlled and tolerated diet. Wants to go home.  Doing discharge now.  Curlene Labrum, MD [facility] [address] [place], [place] [address] [phone] (office)

## 2018-09-16 LAB — HIV ANTIBODY (ROUTINE TESTING W REFLEX): HIV Screen 4th Generation wRfx: NONREACTIVE

## 2018-09-19 ENCOUNTER — Encounter (HOSPITAL_COMMUNITY): Payer: Self-pay | Admitting: General Surgery

## 2018-09-23 DIAGNOSIS — Z8719 Personal history of other diseases of the digestive system: Secondary | ICD-10-CM | POA: Diagnosis not present

## 2018-10-04 ENCOUNTER — Other Ambulatory Visit: Payer: Self-pay

## 2018-10-04 ENCOUNTER — Ambulatory Visit (INDEPENDENT_AMBULATORY_CARE_PROVIDER_SITE_OTHER): Payer: Self-pay | Admitting: General Surgery

## 2018-10-04 DIAGNOSIS — K810 Acute cholecystitis: Secondary | ICD-10-CM

## 2018-10-04 NOTE — Progress Notes (Signed)
[facility]  I am calling the patient for post operative evaluation due to the current COVID 19 pandemic.  The patient had a laparoscopic cholecystectomy on 09/15/2018. The patient reports that they are doing well without issues. She says the glue is peeling off. The are tolerating a diet, having good pain control, and having regular Bms.  The patient has no concerns.   Will see the patient PRN.   Pathology: Diagnosis Gallbladder - ACUTE AND CHRONIC CHOLECYSTITIS AND CHOLELITHIASIS. - ATTACHED HEPATIC PARENCHYMA. - ONE BENIGN PERICYSTIC LYMPH NODE (0/1). - THERE IS NO EVIDENCE OF MALIGNANCY.  Curlene Labrum, MD [facility] [address] [place], [place] [address] [phone] (office)

## 2019-03-28 DIAGNOSIS — Z20828 Contact with and (suspected) exposure to other viral communicable diseases: Secondary | ICD-10-CM | POA: Diagnosis not present

## 2019-03-28 DIAGNOSIS — Z03818 Encounter for observation for suspected exposure to other biological agents ruled out: Secondary | ICD-10-CM | POA: Diagnosis not present

## 2019-04-06 DIAGNOSIS — K080 Exfoliation of teeth due to systemic causes: Secondary | ICD-10-CM | POA: Diagnosis not present

## 2019-04-09 DIAGNOSIS — Z23 Encounter for immunization: Secondary | ICD-10-CM | POA: Diagnosis not present

## 2019-04-30 DIAGNOSIS — Z23 Encounter for immunization: Secondary | ICD-10-CM | POA: Diagnosis not present

## 2019-09-05 DIAGNOSIS — G43909 Migraine, unspecified, not intractable, without status migrainosus: Secondary | ICD-10-CM | POA: Diagnosis not present

## 2019-11-30 DIAGNOSIS — Z20822 Contact with and (suspected) exposure to covid-19: Secondary | ICD-10-CM | POA: Diagnosis not present

## 2019-11-30 DIAGNOSIS — Z03818 Encounter for observation for suspected exposure to other biological agents ruled out: Secondary | ICD-10-CM | POA: Diagnosis not present

## 2020-01-19 DIAGNOSIS — Z03818 Encounter for observation for suspected exposure to other biological agents ruled out: Secondary | ICD-10-CM | POA: Diagnosis not present

## 2020-02-23 DIAGNOSIS — Z23 Encounter for immunization: Secondary | ICD-10-CM | POA: Diagnosis not present

## 2020-02-23 DIAGNOSIS — Z029 Encounter for administrative examinations, unspecified: Secondary | ICD-10-CM | POA: Diagnosis not present

## 2020-03-05 DIAGNOSIS — Z03818 Encounter for observation for suspected exposure to other biological agents ruled out: Secondary | ICD-10-CM | POA: Diagnosis not present

## 2020-03-05 DIAGNOSIS — Z20822 Contact with and (suspected) exposure to covid-19: Secondary | ICD-10-CM | POA: Diagnosis not present

## 2020-11-19 IMAGING — US ULTRASOUND ABDOMEN LIMITED
1 series · 13 of 25 positions shown · non-contrast
Comparison: None.

CLINICAL DATA: Right upper quadrant pain

EXAM:
ULTRASOUND ABDOMEN LIMITED RIGHT UPPER QUADRANT

[Series 1: ultrasound abdomen limited · 0.23mm/px · 13 of 102 slices shown]
[im 1/102]
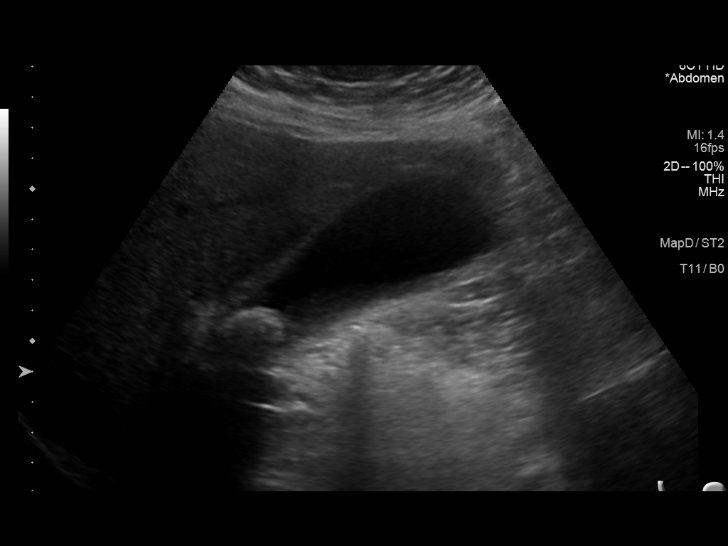
[im 9/102]
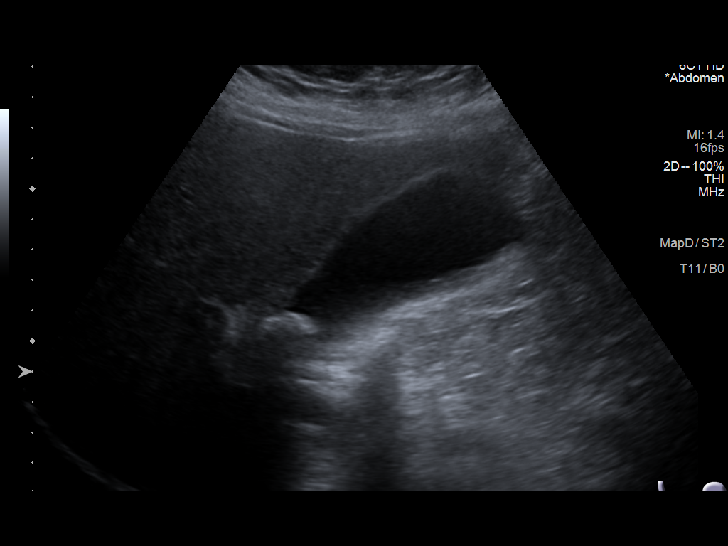
[im 17/102]
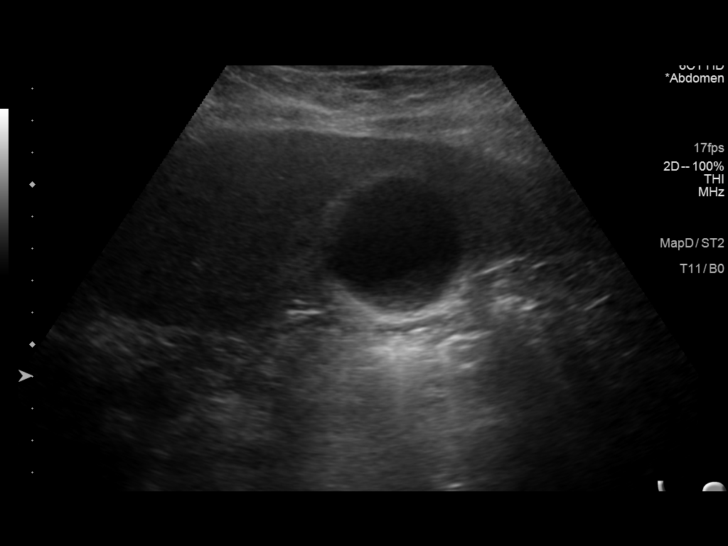
[im 26/102]
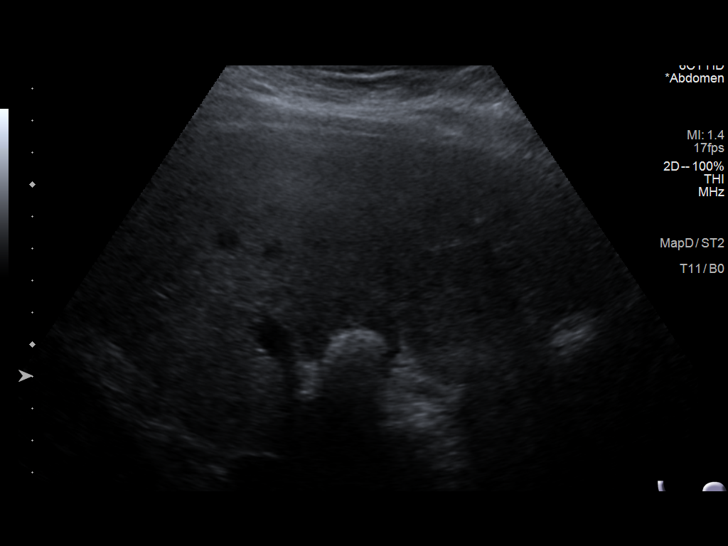
[im 34/102]
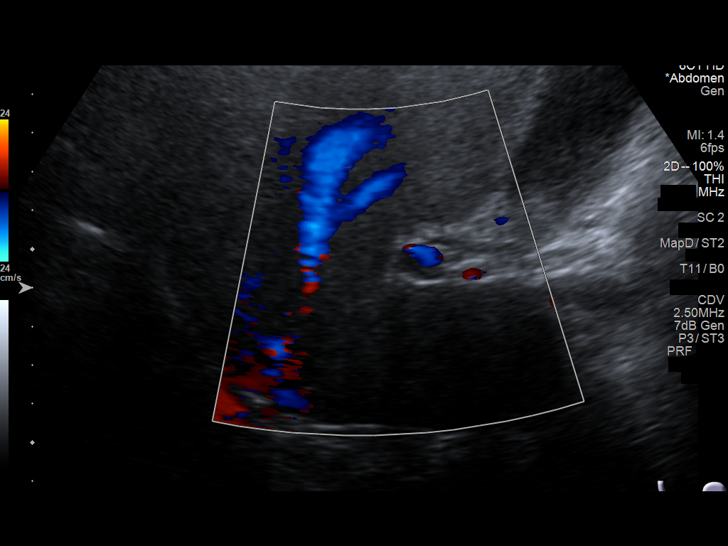
[im 43/102]
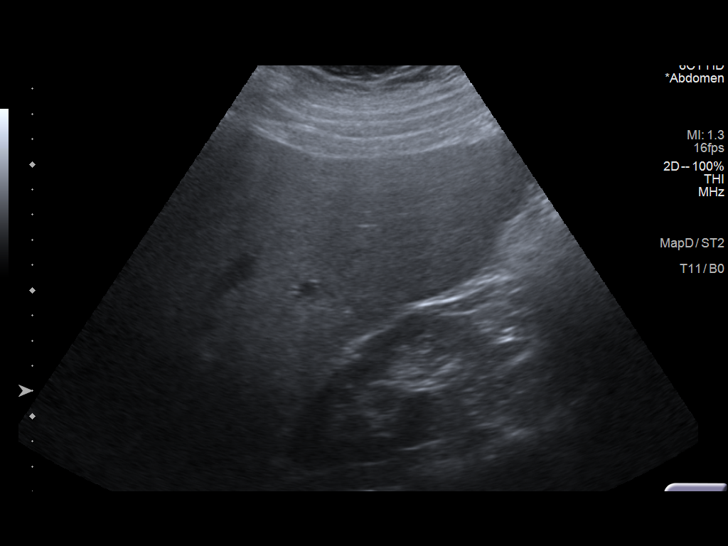
[im 51/102]
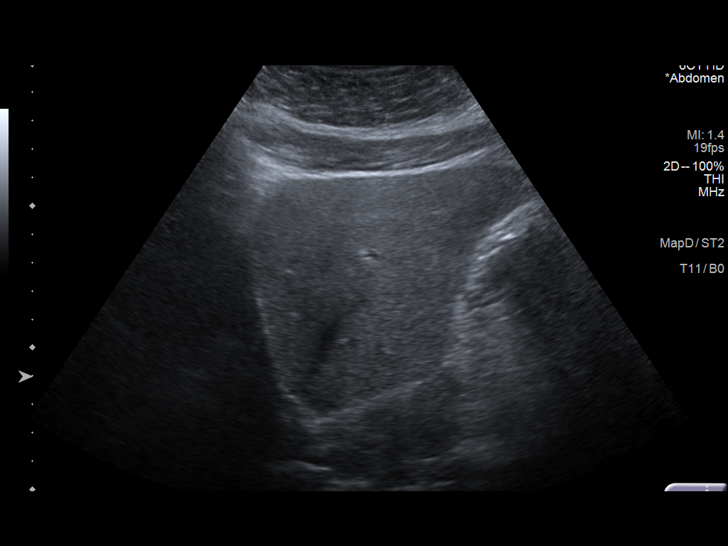
[im 59/102]
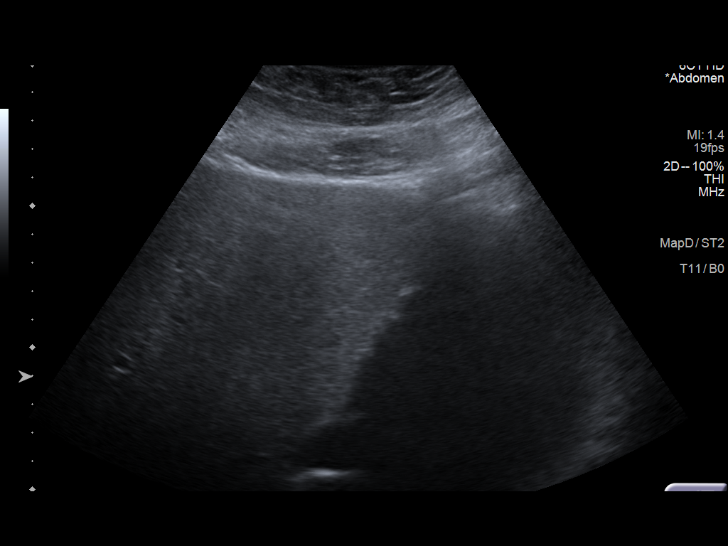
[im 68/102]
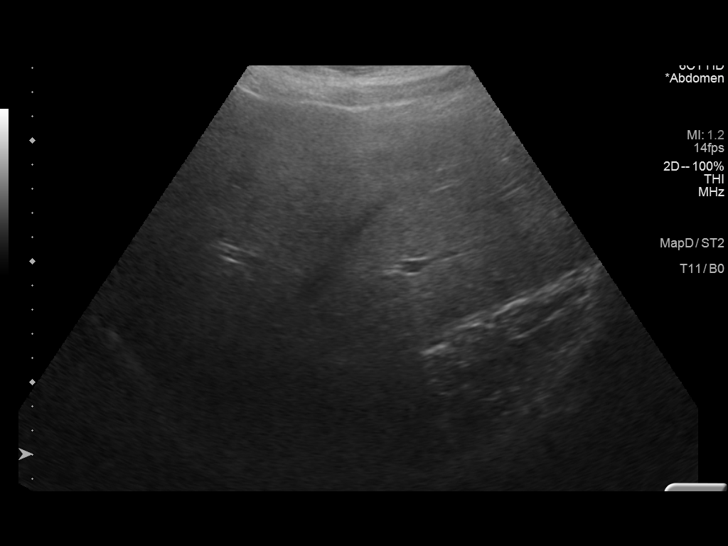
[im 76/102]
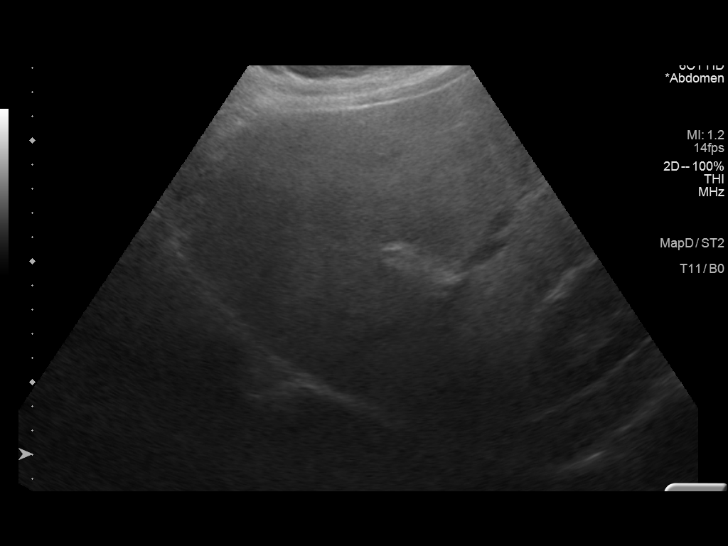
[im 85/102]
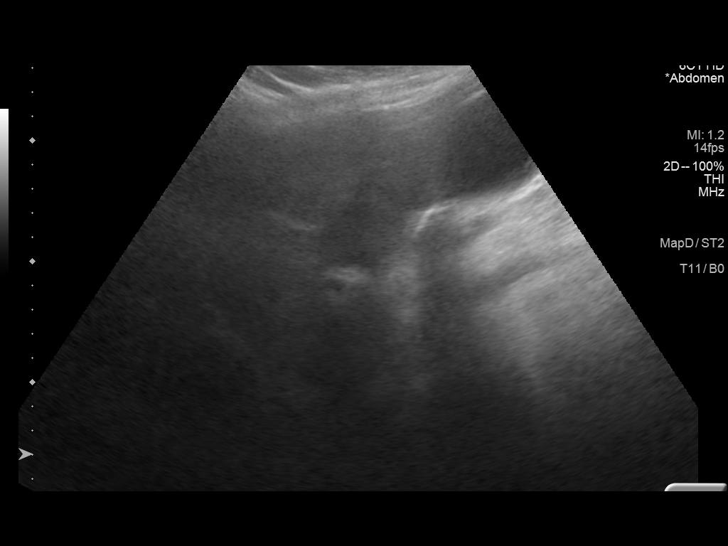
[im 93/102]
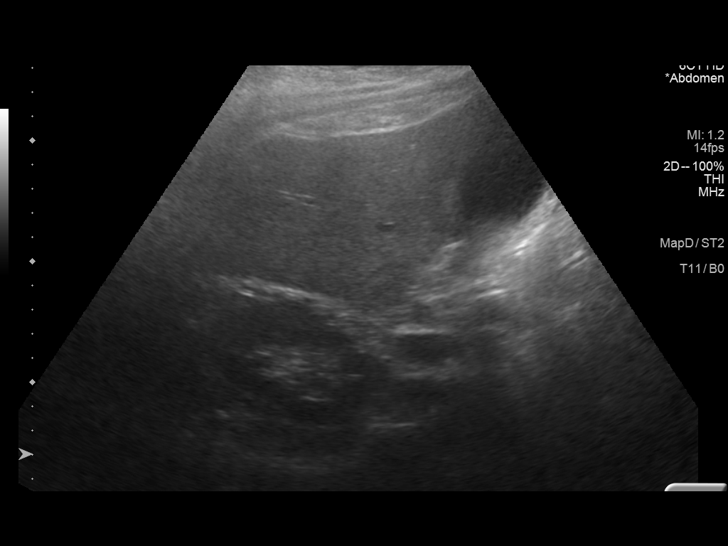
[im 102/102]
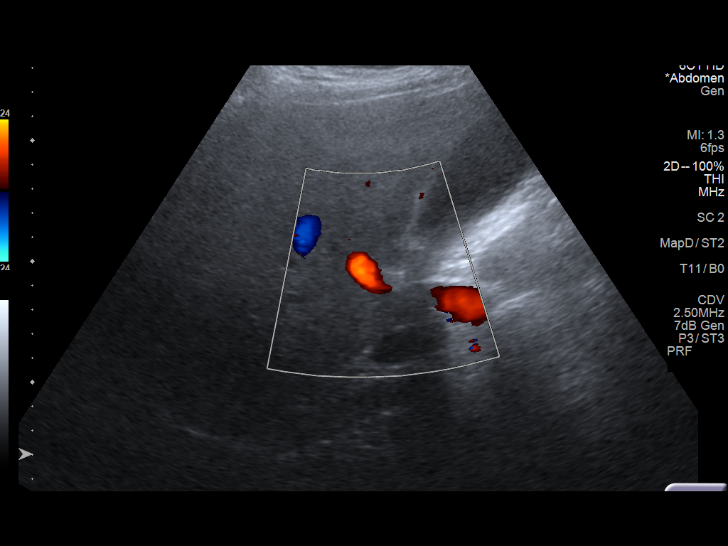

[13 of 25 positions shown; findings below may reference images not displayed]

FINDINGS: Gallbladder:

Within the gallbladder, there is a focal calculus imbedded in the
neck of the gallbladder measuring 2.3 cm. There is also sludge in
the gallbladder. Gallbladder wall is mildly thickened. There is no
pericholecystic fluid. Patient is focally tender over the
gallbladder.

Common bile duct:

Diameter: 3 mm. No intrahepatic or extrahepatic biliary duct
dilatation.

Liver:

No focal lesion identified. Liver echogenicity is increased
diffusely. Portal vein is patent on color Doppler imaging with
normal direction of blood flow towards the liver.

Other: None.
IMPRESSION: 1. 2.3 cm in size gallstone imbedded in the neck of the gallbladder.
Gallbladder wall mildly thickened. Patient is focally tender over
the gallbladder. These are findings concerning for acute
cholecystitis.

2. Diffuse increase in liver echogenicity, a finding indicative of
hepatic steatosis. While no focal liver lesions are evident on this
study, it must be cautioned that the sensitivity of ultrasound for
detection of focal liver lesions is diminished in this circumstance.

These results will be called to the ordering clinician or
representative by the Radiologist Assistant, and communication
documented in the PACS or zVision Dashboard.
